# Patient Record
Sex: Female | Born: 1935 | Race: White | Hispanic: No | Smoking: Never smoker
Health system: Southern US, Community
[De-identification: ages and names within clinical notes are randomized; demographics above are authoritative.]

## PROBLEM LIST (undated history)

## (undated) DIAGNOSIS — I10 Essential (primary) hypertension: Secondary | ICD-10-CM

## (undated) HISTORY — PX: ABDOMINAL HYSTERECTOMY: SHX81

---

## 2003-12-19 ENCOUNTER — Other Ambulatory Visit: Payer: Self-pay

## 2003-12-19 ENCOUNTER — Emergency Department: Payer: Self-pay | Admitting: Emergency Medicine

## 2004-01-25 ENCOUNTER — Ambulatory Visit: Payer: Self-pay | Admitting: Internal Medicine

## 2006-01-21 ENCOUNTER — Ambulatory Visit: Payer: Self-pay | Admitting: Internal Medicine

## 2007-02-18 ENCOUNTER — Ambulatory Visit: Payer: Self-pay | Admitting: Internal Medicine

## 2008-02-22 ENCOUNTER — Ambulatory Visit: Payer: Self-pay | Admitting: Internal Medicine

## 2008-03-22 ENCOUNTER — Ambulatory Visit: Payer: Self-pay | Admitting: Unknown Physician Specialty

## 2008-05-26 ENCOUNTER — Emergency Department: Payer: Self-pay | Admitting: Emergency Medicine

## 2009-02-22 ENCOUNTER — Ambulatory Visit: Payer: Self-pay | Admitting: Internal Medicine

## 2010-02-25 ENCOUNTER — Ambulatory Visit: Payer: Self-pay | Admitting: Internal Medicine

## 2010-10-09 ENCOUNTER — Inpatient Hospital Stay: Payer: Self-pay | Admitting: Specialist

## 2011-07-15 ENCOUNTER — Ambulatory Visit: Payer: Self-pay | Admitting: Specialist

## 2011-07-15 DIAGNOSIS — I10 Essential (primary) hypertension: Secondary | ICD-10-CM

## 2011-07-15 LAB — CBC WITH DIFFERENTIAL/PLATELET
Basophil #: 0 10*3/uL (ref 0.0–0.1)
Basophil %: 0.4 %
Eosinophil %: 1.8 %
HCT: 41.2 % (ref 35.0–47.0)
Lymphocyte #: 1.5 10*3/uL (ref 1.0–3.6)
Lymphocyte %: 21.8 %
MCH: 31.6 pg (ref 26.0–34.0)
MCHC: 33.1 g/dL (ref 32.0–36.0)
MCV: 96 fL (ref 80–100)
Monocyte %: 8.6 %
Neutrophil #: 4.5 10*3/uL (ref 1.4–6.5)
Neutrophil %: 67.4 %
RBC: 4.31 10*6/uL (ref 3.80–5.20)
WBC: 6.7 10*3/uL (ref 3.6–11.0)

## 2011-07-24 ENCOUNTER — Ambulatory Visit: Payer: Self-pay | Admitting: Specialist

## 2011-10-26 ENCOUNTER — Emergency Department: Payer: Self-pay | Admitting: Emergency Medicine

## 2013-09-27 ENCOUNTER — Emergency Department: Payer: Self-pay | Admitting: Emergency Medicine

## 2013-09-27 LAB — URINALYSIS, COMPLETE
Bacteria: NONE SEEN
Glucose,UR: NEGATIVE mg/dL (ref 0–75)
Ketone: NEGATIVE
LEUKOCYTE ESTERASE: NEGATIVE
NITRITE: NEGATIVE
Ph: 8 (ref 4.5–8.0)
Protein: NEGATIVE
SPECIFIC GRAVITY: 1.008 (ref 1.003–1.030)
Squamous Epithelial: <1
WBC UR: 1 /HPF (ref 0–5)

## 2013-09-27 LAB — CBC
HCT: 44.8 % (ref 35.0–47.0)
HGB: 14.5 g/dL (ref 12.0–16.0)
MCH: 30.4 pg (ref 26.0–34.0)
MCHC: 32.4 g/dL (ref 32.0–36.0)
MCV: 94 fL (ref 80–100)
Platelet: 157 10*3/uL (ref 150–440)
RBC: 4.77 10*6/uL (ref 3.80–5.20)
RDW: 13.6 % (ref 11.5–14.5)
WBC: 6.2 10*3/uL (ref 3.6–11.0)

## 2013-09-27 LAB — BASIC METABOLIC PANEL
Anion Gap: 6 — ABNORMAL LOW (ref 7–16)
BUN: 18 mg/dL (ref 7–18)
Calcium, Total: 10.8 mg/dL — ABNORMAL HIGH (ref 8.5–10.1)
Chloride: 106 mmol/L (ref 98–107)
Co2: 26 mmol/L (ref 21–32)
Creatinine: 0.78 mg/dL (ref 0.60–1.30)
EGFR (African American): >60
EGFR (Non-African Amer.): >60
GLUCOSE: 110 mg/dL — AB (ref 65–99)
Osmolality: 278 (ref 275–301)
POTASSIUM: 3.7 mmol/L (ref 3.5–5.1)
Sodium: 138 mmol/L (ref 136–145)

## 2013-09-27 LAB — TROPONIN I

## 2013-10-11 ENCOUNTER — Emergency Department: Payer: Self-pay | Admitting: Emergency Medicine

## 2013-10-11 LAB — BASIC METABOLIC PANEL
ANION GAP: 8 (ref 7–16)
BUN: 20 mg/dL — ABNORMAL HIGH (ref 7–18)
CO2: 24 mmol/L (ref 21–32)
Calcium, Total: 9.8 mg/dL (ref 8.5–10.1)
Chloride: 108 mmol/L — ABNORMAL HIGH (ref 98–107)
Creatinine: 0.71 mg/dL (ref 0.60–1.30)
EGFR (African American): >60
EGFR (Non-African Amer.): >60
Glucose: 117 mg/dL — ABNORMAL HIGH (ref 65–99)
Osmolality: 283 (ref 275–301)
Potassium: 4 mmol/L (ref 3.5–5.1)
Sodium: 140 mmol/L (ref 136–145)

## 2013-10-11 LAB — CBC
HCT: 45.7 % (ref 35.0–47.0)
HGB: 15 g/dL (ref 12.0–16.0)
MCH: 30.8 pg (ref 26.0–34.0)
MCHC: 32.8 g/dL (ref 32.0–36.0)
MCV: 94 fL (ref 80–100)
PLATELETS: 169 10*3/uL (ref 150–440)
RBC: 4.86 10*6/uL (ref 3.80–5.20)
RDW: 13.7 % (ref 11.5–14.5)
WBC: 6 10*3/uL (ref 3.6–11.0)

## 2013-10-11 LAB — TROPONIN I: Troponin-I: <0.02 ng/mL

## 2013-10-17 ENCOUNTER — Ambulatory Visit: Payer: Self-pay | Admitting: Internal Medicine

## 2014-05-21 NOTE — Op Note (Signed)
PATIENT NAME:  Caitlin Solis, Caitlin Solis MR#:  [id number] DATE OF BIRTH:  [birth year]  DATE OF PROCEDURE:  07/24/2011  PREOPERATIVE DIAGNOSIS: Painful hardware of right ankle with exposed distal lateral screw.   POSTOPERATIVE DIAGNOSIS: Painful hardware of right ankle with exposed distal lateral screw.   PROCEDURE PERFORMED: Deep hardware removal of right ankle with soft tissue debridement (Plate, 8 screws, and 2 washers).   SURGEON: Valinda HoarHoward E. Gianfranco Araki, M.Solis.   ANESTHESIA: General LMA.   COMPLICATIONS: None.   DRAINS: None.   DESCRIPTION OF PROCEDURE: The patient was brought to the Operating Room where she underwent satisfactory general LMA anesthesia in the supine position. The right leg was prepped and draped in sterile fashion and an Esmarch was applied. The tourniquet was inflated to 350 mmHg. The lateral wound was reopened through the previous incision with a skin knife. The distalmost screw was just partially exposed. There was no active redness or drainage or sign of active infection there. The soft tissue was elevated off the plate and screws and all 6 screws and plate were removed without difficulty. A rongeur was used to remove built up scar tissue. The skin edges were debrided where the screw had been exposed. The soft tissues were undermined anteriorly and posteriorly to allow better closure of the skin. The wound was then irrigated and closed with staples without difficulty. A small incision was then made over the medial malleolus and dissection carried out sharply where both screws were. These were exposed and removed without difficulty along with their washers. The soft tissues were debrided and irrigated. The      skin was closed with staples. 0.5% Marcaine was placed in both wounds and a dry sterile soft dressing was applied. The tourniquet was deflated with good return of blood flow to the foot. The patient was awakened and taken to recovery in good  condition. ____________________________ Valinda HoarHoward E. Lynnley Doddridge, MD hem:slb Solis: 07/24/2011 10:27:06 ET T: 07/24/2011 11:09:32 ET JOB#: [id number]  cc: Valinda HoarHoward E. Babyboy Loya, MD, <Dictator> Valinda HoarHOWARD E Cattaleya Wien MD ELECTRONICALLY SIGNED 07/24/2011 11:28

## 2018-10-03 ENCOUNTER — Emergency Department: Payer: Medicare Other

## 2018-10-03 ENCOUNTER — Other Ambulatory Visit: Payer: Self-pay

## 2018-10-03 ENCOUNTER — Inpatient Hospital Stay
Admission: EM | Admit: 2018-10-03 | Discharge: 2018-10-06 | DRG: 543 | Disposition: A | Discharge status: Skilled Nursing Facility | Payer: Medicare Other | Attending: Internal Medicine | Admitting: Internal Medicine

## 2018-10-03 ENCOUNTER — Inpatient Hospital Stay: Payer: Medicare Other

## 2018-10-03 DIAGNOSIS — Z993 Dependence on wheelchair: Secondary | ICD-10-CM

## 2018-10-03 DIAGNOSIS — E785 Hyperlipidemia, unspecified: Secondary | ICD-10-CM | POA: Diagnosis present

## 2018-10-03 DIAGNOSIS — I110 Hypertensive heart disease with heart failure: Secondary | ICD-10-CM | POA: Diagnosis present

## 2018-10-03 DIAGNOSIS — M21372 Foot drop, left foot: Secondary | ICD-10-CM | POA: Diagnosis present

## 2018-10-03 DIAGNOSIS — W010XXA Fall on same level from slipping, tripping and stumbling without subsequent striking against object, initial encounter: Secondary | ICD-10-CM | POA: Diagnosis present

## 2018-10-03 DIAGNOSIS — M810 Age-related osteoporosis without current pathological fracture: Secondary | ICD-10-CM | POA: Diagnosis present

## 2018-10-03 DIAGNOSIS — I5032 Chronic diastolic (congestive) heart failure: Secondary | ICD-10-CM | POA: Diagnosis present

## 2018-10-03 DIAGNOSIS — R001 Bradycardia, unspecified: Secondary | ICD-10-CM | POA: Diagnosis present

## 2018-10-03 DIAGNOSIS — Z79899 Other long term (current) drug therapy: Secondary | ICD-10-CM

## 2018-10-03 DIAGNOSIS — H9190 Unspecified hearing loss, unspecified ear: Secondary | ICD-10-CM | POA: Diagnosis present

## 2018-10-03 DIAGNOSIS — Z66 Do not resuscitate: Secondary | ICD-10-CM | POA: Diagnosis present

## 2018-10-03 DIAGNOSIS — M8448XA Pathological fracture, other site, initial encounter for fracture: Secondary | ICD-10-CM | POA: Diagnosis not present

## 2018-10-03 DIAGNOSIS — S3282XA Multiple fractures of pelvis without disruption of pelvic ring, initial encounter for closed fracture: Secondary | ICD-10-CM | POA: Diagnosis not present

## 2018-10-03 DIAGNOSIS — I739 Peripheral vascular disease, unspecified: Secondary | ICD-10-CM | POA: Diagnosis present

## 2018-10-03 DIAGNOSIS — I2510 Atherosclerotic heart disease of native coronary artery without angina pectoris: Secondary | ICD-10-CM | POA: Diagnosis present

## 2018-10-03 DIAGNOSIS — Z888 Allergy status to other drugs, medicaments and biological substances status: Secondary | ICD-10-CM | POA: Diagnosis not present

## 2018-10-03 DIAGNOSIS — Z20828 Contact with and (suspected) exposure to other viral communicable diseases: Secondary | ICD-10-CM | POA: Diagnosis present

## 2018-10-03 DIAGNOSIS — M21371 Foot drop, right foot: Secondary | ICD-10-CM | POA: Diagnosis present

## 2018-10-03 DIAGNOSIS — E213 Hyperparathyroidism, unspecified: Secondary | ICD-10-CM | POA: Diagnosis present

## 2018-10-03 DIAGNOSIS — S329XXA Fracture of unspecified parts of lumbosacral spine and pelvis, initial encounter for closed fracture: Secondary | ICD-10-CM

## 2018-10-03 DIAGNOSIS — Z7982 Long term (current) use of aspirin: Secondary | ICD-10-CM

## 2018-10-03 DIAGNOSIS — I4891 Unspecified atrial fibrillation: Secondary | ICD-10-CM | POA: Diagnosis present

## 2018-10-03 HISTORY — DX: Essential (primary) hypertension: I10

## 2018-10-03 LAB — BASIC METABOLIC PANEL
Anion gap: 13 (ref 5–15)
BUN: 35 mg/dL — ABNORMAL HIGH (ref 8–23)
CO2: 25 mmol/L (ref 22–32)
Calcium: 9.6 mg/dL (ref 8.9–10.3)
Chloride: 99 mmol/L (ref 98–111)
Creatinine, Ser: 0.98 mg/dL (ref 0.44–1.00)
GFR calc Af Amer: 58 mL/min — ABNORMAL LOW (ref >60)
GFR calc non Af Amer: 50 mL/min — ABNORMAL LOW (ref >60)
Glucose, Bld: 156 mg/dL — ABNORMAL HIGH (ref 70–99)
Potassium: 3.9 mmol/L (ref 3.5–5.1)
Sodium: 137 mmol/L (ref 135–145)

## 2018-10-03 LAB — CBC
HCT: 41.2 % (ref 36.0–46.0)
Hemoglobin: 13.6 g/dL (ref 12.0–15.0)
MCH: 30.4 pg (ref 26.0–34.0)
MCHC: 33 g/dL (ref 30.0–36.0)
MCV: 92.2 fL (ref 80.0–100.0)
Platelets: 181 10*3/uL (ref 150–400)
RBC: 4.47 MIL/uL (ref 3.87–5.11)
RDW: 14.2 % (ref 11.5–15.5)
WBC: 12.4 10*3/uL — ABNORMAL HIGH (ref 4.0–10.5)
nRBC: 0 % (ref 0.0–0.2)

## 2018-10-03 LAB — TROPONIN I (HIGH SENSITIVITY)
Troponin I (High Sensitivity): 16 ng/L (ref <18)
Troponin I (High Sensitivity): 13 ng/L (ref <18)

## 2018-10-03 LAB — BRAIN NATRIURETIC PEPTIDE: B Natriuretic Peptide: 150 pg/mL — ABNORMAL HIGH (ref 0.0–100.0)

## 2018-10-03 MED ORDER — FENTANYL CITRATE (PF) 100 MCG/2ML IJ SOLN
25.0000 ug | Freq: Once | INTRAMUSCULAR | Status: AC
  Administered 2018-10-03: 11:00:00 25 ug via INTRAVENOUS
  Filled 2018-10-03: qty 2

## 2018-10-03 MED ORDER — LISINOPRIL 20 MG PO TABS
40.0000 mg | ORAL_TABLET | Freq: Every day | ORAL | Status: DC
  Administered 2018-10-04 – 2018-10-06 (×3): 40 mg via ORAL
  Filled 2018-10-03 (×3): qty 2

## 2018-10-03 MED ORDER — CALCIUM CARBONATE-VITAMIN D 500-200 MG-UNIT PO TABS
1.0000 | ORAL_TABLET | Freq: Every day | ORAL | Status: DC
  Administered 2018-10-04 – 2018-10-06 (×3): 1 via ORAL
  Filled 2018-10-03 (×3): qty 1

## 2018-10-03 MED ORDER — MORPHINE SULFATE (PF) 2 MG/ML IV SOLN
0.5000 mg | INTRAVENOUS | Status: DC | PRN
  Administered 2018-10-03 – 2018-10-04 (×2): 0.5 mg via INTRAVENOUS
  Filled 2018-10-03 (×3): qty 1

## 2018-10-03 MED ORDER — HYDRALAZINE HCL 50 MG PO TABS
25.0000 mg | ORAL_TABLET | Freq: Two times a day (BID) | ORAL | Status: DC
  Administered 2018-10-03 – 2018-10-06 (×5): 25 mg via ORAL
  Filled 2018-10-03 (×6): qty 1

## 2018-10-03 MED ORDER — AMLODIPINE BESYLATE 5 MG PO TABS
5.0000 mg | ORAL_TABLET | Freq: Every day | ORAL | Status: DC
  Administered 2018-10-04 – 2018-10-06 (×3): 5 mg via ORAL
  Filled 2018-10-03 (×3): qty 1

## 2018-10-03 MED ORDER — HYDROCODONE-ACETAMINOPHEN 5-325 MG PO TABS
1.0000 | ORAL_TABLET | Freq: Four times a day (QID) | ORAL | Status: DC | PRN
  Administered 2018-10-06: 18:00:00 1 via ORAL
  Filled 2018-10-03: qty 2

## 2018-10-03 MED ORDER — ALENDRONATE SODIUM 70 MG PO TABS: 70.0000 mg | ORAL_TABLET | ORAL | Status: DC

## 2018-10-03 MED ORDER — VITAMIN D 25 MCG (1000 UNIT) PO TABS
1000.0000 [IU] | ORAL_TABLET | Freq: Every day | ORAL | Status: DC
  Administered 2018-10-04 – 2018-10-06 (×3): 1000 [IU] via ORAL
  Filled 2018-10-03 (×3): qty 1

## 2018-10-03 MED ORDER — CARVEDILOL 25 MG PO TABS
25.0000 mg | ORAL_TABLET | Freq: Two times a day (BID) | ORAL | Status: DC
  Administered 2018-10-03 – 2018-10-06 (×5): 25 mg via ORAL
  Filled 2018-10-03 (×6): qty 1

## 2018-10-03 MED ORDER — POTASSIUM CHLORIDE CRYS ER 10 MEQ PO TBCR
10.0000 meq | EXTENDED_RELEASE_TABLET | Freq: Every day | ORAL | Status: DC
  Administered 2018-10-04 – 2018-10-06 (×3): 10 meq via ORAL
  Filled 2018-10-03 (×3): qty 1

## 2018-10-03 MED ORDER — HYDRALAZINE HCL 50 MG PO TABS: 25.0000 mg | ORAL_TABLET | Freq: Two times a day (BID) | ORAL | Status: DC

## 2018-10-03 NOTE — ED Notes (Signed)
Pt taken to xray via stretcher  

## 2018-10-03 NOTE — ED Notes (Signed)
Report given to Ashley, RN

## 2018-10-03 NOTE — ED Notes (Signed)
ED TO INPATIENT HANDOFF REPORT  ED Nurse Name and Phone #: Margie Brink [phone]  S Name/Age/Gender Caitlin BorgEmma D Solis [age] y.o. female Room/Bed: ED19A/ED19A  Code Status   Code Status: Full Code  Home/SNF/Other Home Patient oriented to: self, place, time and situation Is this baseline? Yes   Triage Complete: Triage complete  Chief Complaint fall  Triage Note Pt arrives [facility] from home. Fell in bathroom this AM. Only c/o is R hip pain. No shortening or rotation noted. Pt vomited once at home. Pt c/o nausea upon arrival. HOH. A&O. Normally uses wheelchair and pivots per EMS. Pt was 88% RA with EMS, placed on 3 L [place] and came up to 98%. Does NOT normally wear oxygen. Hx HTN.    Allergies Allergies  Allergen Reactions  . Mirabegron Nausea Only and Nausea And Vomiting    Level of Care/Admitting Diagnosis ED Disposition    ED Disposition Condition Comment   Admit  [facility] [[id number]]  Level of Care: Med-Surg [16]  Covid Evaluation: Asymptomatic Screening Protocol (No Symptoms)  Diagnosis: Bilateral sacral insufficiency fracture [578469][[id number]]  Admitting Physician: Cristie HemUMA, ELIZABETH ACHIENG [[id number]]  Attending Physician: Webb SilversmithUMA, ELIZABETH ACHIENG [[id number]]  Estimated length of stay: past midnight tomorrow  Certification:: I certify this patient will need inpatient services for at least 2 midnights  PT Class (Do Not Modify): Inpatient [101]  PT Acc Code (Do Not Modify): Private [1]       B Medical/Surgery History Past Medical History:  Diagnosis Date  . Hypertension    Past Surgical History:  Procedure Laterality Date  . ABDOMINAL HYSTERECTOMY       A IV Location/Drains/Wounds Patient Lines/Drains/Airways Status   Active Line/Drains/Airways    Name:   Placement date:   Placement time:   Site:   Days:   Peripheral IV 10/03/18 Right Antecubital   10/03/18    0949    Antecubital   less than 1          Intake/Output Last 24 hours No intake or  output data in the 24 hours ending 10/03/18 1322  Labs/Imaging Results for orders placed or performed during the hospital encounter of 10/03/18 (from the past 48 hour(s))  CBC     Status: Abnormal   Collection Time: 10/03/18  9:49 AM  Result Value Ref Range   WBC 12.4 (H) 4.0 - 10.5 K/uL   RBC 4.47 3.87 - 5.11 MIL/uL   Hemoglobin 13.6 12.0 - 15.0 g/dL   HCT 41.341.2 24.436.0 - 01.046.0 %   MCV 92.2 80.0 - 100.0 fL   MCH 30.4 26.0 - 34.0 pg   MCHC 33.0 30.0 - 36.0 g/dL   RDW 27.214.2 53.611.5 - 64.415.5 %   Platelets 181 150 - 400 K/uL   nRBC 0.0 0.0 - 0.2 %    Comment: Performed at [facility], [address] [place], [place] [address]  Brain natriuretic peptide     Status: Abnormal   Collection Time: 10/03/18  9:49 AM  Result Value Ref Range   B Natriuretic Peptide 150.0 (H) 0.0 - 100.0 pg/mL    Comment: Performed at [facility], [address] [place], [place] [address]  Troponin I (High Sensitivity)     Status: None   Collection Time: 10/03/18  9:49 AM  Result Value Ref Range   Troponin I (High Sensitivity) 16 <18 ng/L    Comment: (NOTE) Elevated high sensitivity troponin I (hsTnI) values and significant  changes across serial measurements may suggest  ACS but many other  chronic and acute conditions are known to elevate hsTnI results.  Refer to the "Links" section for chest pain algorithms and additional  guidance. Performed at [facility], [address] [place], [place] [address]   Basic metabolic panel     Status: Abnormal   Collection Time: 10/03/18 11:09 AM  Result Value Ref Range   Sodium 137 135 - 145 mmol/L   Potassium 3.9 3.5 - 5.1 mmol/L   Chloride 99 98 - 111 mmol/L   CO2 25 22 - 32 mmol/L   Glucose, Bld 156 (H) 70 - 99 mg/dL   BUN 35 (H) 8 - 23 mg/dL   Creatinine, Ser 3.33 0.44 - 1.00 mg/dL   Calcium 9.6 8.9 - 83.2 mg/dL   GFR calc non Af Amer 50 (L) >60 mL/min   GFR calc Af Amer 58 (L) >60 mL/min   Anion gap 13 5 - 15    Comment: Performed  at [facility], [address] [place], [place] [address]  Troponin I (High Sensitivity)     Status: None   Collection Time: 10/03/18 11:09 AM  Result Value Ref Range   Troponin I (High Sensitivity) 13 <18 ng/L    Comment: (NOTE) Elevated high sensitivity troponin I (hsTnI) values and significant  changes across serial measurements may suggest ACS but many other  chronic and acute conditions are known to elevate hsTnI results.  Refer to the "Links" section for chest pain algorithms and additional  guidance. Performed at [facility], [address] [place], [place] [address]    Ct Pelvis Wo Contrast  Result Date: 10/03/2018 CLINICAL DATA:  Pelvic and right hip pain after fall getting out of a bathtub today. Initial encounter. EXAM: CT OF THE PELVIS EXTREMITY WITHOUT CONTRAST TECHNIQUE: Multidetector CT imaging of the pelvis was performed according to the standard protocol. Multiplanar CT image reconstructions were also generated. COMPARISON:  Single-view of the pelvis 10/26/2011. FINDINGS: Bones/Joint/Cartilage There are acute bilateral sacral fractures. Left sacral fracture is nondisplaced. There is fragment override of approximately 0.7 cm in the right sacrum. The patient also has acute nondisplaced high bilateral pubic ramus fractures. There is a nondisplaced fracture of the left inferior pubic ramus. Fracture of the right inferior pubic ramus demonstrates 1 shaft width lateral displacement of the anterior fragment and fracture override of 1 cm. No other fracture is identified. Specifically, no hip fracture seen. Hips are located. Moderate degenerative change is present about both hips. Chondrocalcinosis of the left and right labrum is identified. Degenerative change is present at the symphysis pubis. Lower lumbar spine demonstrates loss of disc space height at L4-5 and L5-S1. There is also facet degenerative disease at these levels. Ligaments Suboptimally assessed by CT.  Muscles and Tendons There is a hematoma in the inferior aspect of the right gluteus maximus measuring approximately 4.5 cm transverse by 2.8 cm AP by 3 cm craniocaudal. No muscle tear is identified. Soft tissues Imaged intrapelvic contents demonstrate postoperative change of hysterectomy. Infiltration of subcutaneous fat inferior to the ischial tuberosities bilaterally may be due to decubitus ulcers. IMPRESSION: The examination is positive for bilateral sacral and bilateral superior and inferior pubic ramus fractures. Negative for hip fracture. Focal hematoma in the inferior aspect of the right gluteus maximus. Findings suggestive of bilateral decubitus ulcers over the ischial tuberosities. Lower lumbar spondylosis and moderate appearing bilateral hip osteoarthritis. Electronically Signed   By: Drusilla Kanner M.D.   On: 10/03/2018 10:55   Dg  Chest Port 1 View  Result Date: 10/03/2018 CLINICAL DATA:  Pt states she had bathed self off and was getting out of tub and fell onto R side in bathroom. Vomited once at home. Only c/o is R hip pain. No bruising noted at this time. Pt denies taking blood thinners EXAM: PORTABLE CHEST 1 VIEW COMPARISON:  10/09/2010 FINDINGS: The heart is mildly enlarged. There is atherosclerotic calcification of the thoracic aorta. No pneumothorax. No consolidation or edema. There is deformity of numerous ribs, consistent with bilateral remote fractures. Remote fracture of the LEFT clavicle and RIGHT proximal humerus. No definite acute fractures. IMPRESSION: 1. No evidence for acute cardiopulmonary abnormality. 2. Multiple remote fractures. Electronically Signed   By: Nolon Nations M.D.   On: 10/03/2018 10:42   Dg Hip Unilat  With Pelvis 2-3 Views Right  Result Date: 10/03/2018 CLINICAL DATA:  Pt states she had bathed self off and was getting out of tub and fell onto R side in bathroom. Vomited once at home. Only c/o is R hip pain. No bruising noted at this time. Pt denies taking blood  thinners. EXAM: DG HIP (WITH OR WITHOUT PELVIS) 2-3V RIGHT COMPARISON:  10/26/2011 FINDINGS: There is a comminuted fracture of the RIGHT superior pubic ramus. There is a fracture of the RIGHT inferior pubic ramus. There are fractures of the LEFT superior and inferior pubic rami. Degenerative changes are seen in both hips. The RIGHT hip is intact. No abnormality identified in the LEFT hip. IMPRESSION: Multiple pelvic fractures. Fractures involving at least the RIGHT superior and inferior pubic rami, LEFT superior and inferior pubic rami. RIGHT hip is intact. Given the mechanism of injury, sacral fractures should be considered but are not seen radiographically. Electronically Signed   By: Nolon Nations M.D.   On: 10/03/2018 10:41    Pending Labs Unresulted Labs (From admission, onward)    Start     Ordered   10/03/18 1158  SARS CORONAVIRUS 2 (TAT 6-24 HRS) Nasopharyngeal Nasopharyngeal Swab  (Asymptomatic/Tier 2 Patients Labs)  ONCE - STAT,   STAT    Question Answer Comment  Is this test for diagnosis or screening Screening   Symptomatic for COVID-19 as defined by CDC No   Hospitalized for COVID-19 No   Admitted to ICU for COVID-19 No   Previously tested for COVID-19 No   Resident in a congregate (group) care setting No   Employed in healthcare setting No   Pregnant No      10/03/18 1157          Vitals/Pain Today's Vitals   10/03/18 0941 10/03/18 0942 10/03/18 0946 10/03/18 1106  BP: (!) 215/78  (!) 215/78   Pulse: 76  61   Resp:   (!) 30   Temp:   97.6 F (36.4 C)   TempSrc:   Oral   SpO2: (!) 81%  (S) 98%   Weight:  54.4 kg    Height:  5\' 4"  (1.626 m)    PainSc:  10-Worst pain ever  10-Worst pain ever    Isolation Precautions No active isolations  Medications Medications  amLODipine (NORVASC) tablet 5 mg (has no administration in time range)  carvedilol (COREG) tablet 25 mg (has no administration in time range)  lisinopril (ZESTRIL) tablet 40 mg (has no administration  in time range)  calcium-vitamin D (OSCAL WITH D) 500-200 MG-UNIT per tablet 1 tablet (has no administration in time range)  cholecalciferol (VITAMIN D3) tablet 1,000 Units (has no administration in time range)  potassium chloride SA (K-DUR) CR tablet 10 mEq (has no administration in time range)  HYDROcodone-acetaminophen (NORCO/VICODIN) 5-325 MG per tablet 1-2 tablet (has no administration in time range)  morphine 2 MG/ML injection 0.5 mg (has no administration in time range)  hydrALAZINE (APRESOLINE) tablet 25 mg (has no administration in time range)  fentaNYL (SUBLIMAZE) injection 25 mcg (25 mcg Intravenous Given 10/03/18 1105)    Mobility non-ambulatory High fall risk    R Recommendations: See Admitting Provider Note  Report given to:   Additional Notes: Pelvic fractures

## 2018-10-03 NOTE — ED Notes (Signed)
Pt taken to CT.

## 2018-10-03 NOTE — ED Triage Notes (Signed)
Pt arrives [facility] from home. Fell in bathroom this AM. Only c/o is R hip pain. No shortening or rotation noted. Pt vomited once at home. Pt c/o nausea upon arrival. HOH. A&O. Normally uses wheelchair and pivots per EMS. Pt was 88% RA with EMS, placed on 3 L [place] and came up to 98%. Does NOT normally wear oxygen. Hx HTN.

## 2018-10-03 NOTE — ED Provider Notes (Signed)
[facility] Emergency Department Provider Note   ____________________________________________   First MD Initiated Contact with Patient 10/03/18 [id number]     (approximate)  I have reviewed the triage vital signs and the nursing notes.   HISTORY  Chief Complaint Hip Pain    HPI Caitlin Solis is a 85 y.o. female here for evaluation for pain over her right hip  Patient was using her bar when she reports she is transferring to the toilet, she missed the bar and fell onto her right hip.  Denies hitting her head or other injury but she is having a lot of pain over the area of her right hip.  No fevers or chills.  No nausea vomiting.  Has a lot of balance problems all the time according to the patient's daughter-in-law.  No exposure to COVID.  No fevers.  She reports significant sharp pain pointing over her right lateral pelvis right hip region with movement.  Appears in pain with any movement, but reports minimal discomfort at rest   Past Medical History:  Diagnosis Date   Hypertension     There are no active problems to display for this patient.   Past Surgical History:  Procedure Laterality Date   ABDOMINAL HYSTERECTOMY      Prior to Admission medications   Not on File    Allergies Patient has no known allergies.  History reviewed. No pertinent family history.  Social History Social History   Tobacco Use   Smoking status: Never Smoker  Substance Use Topics   Alcohol use: Not Currently    Frequency: Never   Drug use: Not on file    Review of Systems Constitutional: No fever/chills Eyes: No visual changes. Cardiovascular: Denies chest pain. Respiratory: Denies shortness of breath. Gastrointestinal: No abdominal pain.   Musculoskeletal: Negative for back pain or neck pain.  No injury to the lower legs except over the right hip.  No pain in the left leg or hip.  No pain in the arms. Skin: Negative for rash. Neurological:  Negative for headaches, areas of focal weakness or numbness.    ____________________________________________   PHYSICAL EXAM:  VITAL SIGNS: ED Triage Vitals  Enc Vitals Group     BP 10/03/18 0941 (!) 215/78     Pulse Rate 10/03/18 0941 76     Resp 10/03/18 0946 (!) 30     Temp 10/03/18 0946 97.6 F (36.4 C)     Temp Source 10/03/18 0946 Oral     SpO2 10/03/18 0941 (!) 81 %     Weight 10/03/18 0942 120 lb (54.4 kg)     Height 10/03/18 0942 5\' 4"  (1.626 m)     Head Circumference --      Peak Flow --      Pain Score 10/03/18 0942 10     Pain Loc --      Pain Edu? --      Excl. in [place]? --     Constitutional: Alert and oriented. Well appearing and in no acute distress.  Very hard of hearing Eyes: Conjunctivae are normal. Head: Atraumatic. Nose: No congestion/rhinnorhea. Mouth/Throat: Mucous membranes are moist. Neck: No stridor.  Cardiovascular: Normal rate, regular rhythm. Grossly normal heart sounds.  Good peripheral circulation. Respiratory: Normal respiratory effort.  No retractions. Lungs CTAB. Gastrointestinal: Soft and nontender. No distention. Musculoskeletal: Focally tender over the superior portion of the greater trochanter and the right pelvic region.  No deformities or shortening.  Left lower extremity full range  of motion without pain or discomfort on any pain joint or long bone.  Right lower extremity movement induces pain around the right upper hip region right lower pelvis.  Good peripheral perfusion and normal cap refill lower extremities bilateral.  Able to wiggle toes and intact sensation normally over the right leg Neurologic:  Normal speech and language. No gross focal neurologic deficits are appreciated.  Skin:  Skin is warm, dry and intact. No rash noted. Psychiatric: Mood and affect are normal. Speech and behavior are normal.  ____________________________________________   LABS (all labs ordered are listed, but only abnormal results are  displayed)  Labs Reviewed  CBC - Abnormal; Notable for the following components:      Result Value   WBC 12.4 (*)    All other components within normal limits  BRAIN NATRIURETIC PEPTIDE - Abnormal; Notable for the following components:   B Natriuretic Peptide 150.0 (*)    All other components within normal limits  BASIC METABOLIC PANEL - Abnormal; Notable for the following components:   Glucose, Bld 156 (*)    BUN 35 (*)    GFR calc non Af Amer 50 (*)    GFR calc Af Amer 58 (*)    All other components within normal limits  SARS CORONAVIRUS 2 (TAT 6-24 HRS)  TROPONIN I (HIGH SENSITIVITY)  TROPONIN I (HIGH SENSITIVITY)   ____________________________________________  EKG  I have reviewed interpreted at 945 Heart rate 109 QRS 130 QTc 500 Somewhat poor baseline, probably A. fib, right bundle branch block.  No evidence of acute ischemia.  Nonspecific T wave abnormality ____________________________________________  RADIOLOGY  Ct Pelvis Wo Contrast  Result Date: 10/03/2018 CLINICAL DATA:  Pelvic and right hip pain after fall getting out of a bathtub today. Initial encounter. EXAM: CT OF THE PELVIS EXTREMITY WITHOUT CONTRAST TECHNIQUE: Multidetector CT imaging of the pelvis was performed according to the standard protocol. Multiplanar CT image reconstructions were also generated. COMPARISON:  Single-view of the pelvis 10/26/2011. FINDINGS: Bones/Joint/Cartilage There are acute bilateral sacral fractures. Left sacral fracture is nondisplaced. There is fragment override of approximately 0.7 cm in the right sacrum. The patient also has acute nondisplaced high bilateral pubic ramus fractures. There is a nondisplaced fracture of the left inferior pubic ramus. Fracture of the right inferior pubic ramus demonstrates 1 shaft width lateral displacement of the anterior fragment and fracture override of 1 cm. No other fracture is identified. Specifically, no hip fracture seen. Hips are located. Moderate  degenerative change is present about both hips. Chondrocalcinosis of the left and right labrum is identified. Degenerative change is present at the symphysis pubis. Lower lumbar spine demonstrates loss of disc space height at L4-5 and L5-S1. There is also facet degenerative disease at these levels. Ligaments Suboptimally assessed by CT. Muscles and Tendons There is a hematoma in the inferior aspect of the right gluteus maximus measuring approximately 4.5 cm transverse by 2.8 cm AP by 3 cm craniocaudal. No muscle tear is identified. Soft tissues Imaged intrapelvic contents demonstrate postoperative change of hysterectomy. Infiltration of subcutaneous fat inferior to the ischial tuberosities bilaterally may be due to decubitus ulcers. IMPRESSION: The examination is positive for bilateral sacral and bilateral superior and inferior pubic ramus fractures. Negative for hip fracture. Focal hematoma in the inferior aspect of the right gluteus maximus. Findings suggestive of bilateral decubitus ulcers over the ischial tuberosities. Lower lumbar spondylosis and moderate appearing bilateral hip osteoarthritis. Electronically Signed   By: Drusilla Kanner M.D.   On: 10/03/2018 10:55  Dg Chest Port 1 View  Result Date: 10/03/2018 CLINICAL DATA:  Pt states she had bathed self off and was getting out of tub and fell onto R side in bathroom. Vomited once at home. Only c/o is R hip pain. No bruising noted at this time. Pt denies taking blood thinners EXAM: PORTABLE CHEST 1 VIEW COMPARISON:  10/09/2010 FINDINGS: The heart is mildly enlarged. There is atherosclerotic calcification of the thoracic aorta. No pneumothorax. No consolidation or edema. There is deformity of numerous ribs, consistent with bilateral remote fractures. Remote fracture of the LEFT clavicle and RIGHT proximal humerus. No definite acute fractures. IMPRESSION: 1. No evidence for acute cardiopulmonary abnormality. 2. Multiple remote fractures. Electronically  Signed   By: Norva PavlovElizabeth  Brown M.D.   On: 10/03/2018 10:42   Dg Hip Unilat  With Pelvis 2-3 Views Right  Result Date: 10/03/2018 CLINICAL DATA:  Pt states she had bathed self off and was getting out of tub and fell onto R side in bathroom. Vomited once at home. Only c/o is R hip pain. No bruising noted at this time. Pt denies taking blood thinners. EXAM: DG HIP (WITH OR WITHOUT PELVIS) 2-3V RIGHT COMPARISON:  10/26/2011 FINDINGS: There is a comminuted fracture of the RIGHT superior pubic ramus. There is a fracture of the RIGHT inferior pubic ramus. There are fractures of the LEFT superior and inferior pubic rami. Degenerative changes are seen in both hips. The RIGHT hip is intact. No abnormality identified in the LEFT hip. IMPRESSION: Multiple pelvic fractures. Fractures involving at least the RIGHT superior and inferior pubic rami, LEFT superior and inferior pubic rami. RIGHT hip is intact. Given the mechanism of injury, sacral fractures should be considered but are not seen radiographically. Electronically Signed   By: Norva PavlovElizabeth  Brown M.D.   On: 10/03/2018 10:41    CT reviewed multiple pelvic fractures.  Right superior and inferior pubic rami.  Left superior and inferior pubic rami.  Right hip is intact.  Reviewed by me.  Chest x-ray negative for acute. ____________________________________________   PROCEDURES  Procedure(s) performed: None  Procedures  Critical Care performed: No  ____________________________________________   INITIAL IMPRESSION / ASSESSMENT AND PLAN / ED COURSE  Pertinent labs & imaging results that were available during my care of the patient were reviewed by me and considered in my medical decision making (see chart for details).   Patient reports mechanical fall without head injury.  Reassuring clinical exam, except notable pain discomfort over palpation of the right pelvic region.  X-ray imaging and CT imaging performed which demonstrate multiple pelvic fractures and  a small hematoma.  Discussed with the patient and her family, due to severity of pain with any movement I do not anticipate she build to go home without better pain control regimen, physical therapy, and likely some type of rehab type planning as her son is also in his 2670s and reports to be very difficult to care for her per her daughter-in-law in the home setting.  I would agree with this, patient comfortable with plan for admission.  Pain is improved with fentanyl and rest, but movement induces pain and discomfort    Discussed with Lanora ManisElizabeth nurse practitioner hospitalist service   Glenetta BorgEmma D Veron was evaluated in Emergency Department on 10/03/2018 for the symptoms described in the history of present illness. She was evaluated in the context of the global COVID-19 pandemic, which necessitated consideration that the patient might be at risk for infection with the SARS-CoV-2 virus that causes COVID-19. Institutional  protocols and algorithms that pertain to the evaluation of patients at risk for COVID-19 are in a state of rapid change based on information released by regulatory bodies including the CDC and federal and state organizations. These policies and algorithms were followed during the patient's care in the ED. ____________________________________________   FINAL CLINICAL IMPRESSION(S) / ED DIAGNOSES  Final diagnoses:  Closed nondisplaced fracture of pelvis, unspecified part of pelvis, initial encounter [facility](HCC)        Note:  This document was prepared using Dragon voice recognition software and may include unintentional dictation errors       Sharyn CreamerQuale, Reighlyn Elmes, MD 10/03/18 1205

## 2018-10-03 NOTE — Progress Notes (Signed)
PHARMACIST - PHYSICIAN COMMUNICATION  CONCERNING: P&T Medication Policy Regarding Oral Bisphosphonates  RECOMMENDATION: Your order for alendronate (Fosamax), ibandronate (Boniva), or risedronate (Actonel) has been discontinued at this time.  If the patient's post-hospital medical condition warrants safe use of this class of drugs, please resume the pre-hospital regimen upon discharge.  DESCRIPTION:  Alendronate (Fosamax), ibandronate (Boniva), and risedronate (Actonel) can cause severe esophageal erosions in patients who are unable to remain upright at least 30 minutes after taking this medication.   Since brief interruptions in therapy are thought to have minimal impact on bone mineral density, the [place] has established that bisphosphonate orders should be routinely discontinued during hospitalization.   To override this safety policy and permit administration of Boniva, Fosamax, or Actonel in the hospital, prescribers must write "DO NOT HOLD" in the comments section when placing the order for this class of medications.   Pernell Dupre, PharmD, BCPS Clinical Pharmacist 10/03/2018 1:11 PM

## 2018-10-03 NOTE — ED Notes (Signed)
Pt states she had bathed self off and was getting out of tub and fell onto R side in bathroom. Vomited once at home. Only c/o is R hip pain. No bruising noted at this time. Pt denies taking blood thinners. Pt was hypoxic at 81% RA, placed on 3 L [place] and is up to 98%, will continue to monitor.

## 2018-10-03 NOTE — H&P (Addendum)
[facility] NAME: Caitlin Solis    MR#:  [id number]  DATE OF BIRTH:  02-25-1924  DATE OF ADMISSION:  10/03/2018  PRIMARY CARE PHYSICIAN: Patient, No Pcp Per   REQUESTING/REFERRING PHYSICIAN: Delman Kitten MD  CHIEF COMPLAINT:   Chief Complaint  Patient presents with   Hip Pain    HISTORY OF PRESENT ILLNESS:   85 year old female with past medical history of hypertension, hyperparathyroidism, CHF,CAD, hyperlipidemia, PVD, bradycardia, and osteoporosis wheelchair bound at baseline presenting to the ED with chief complaints of right hip pain following a fall.  Patient states she was taking a bath in her tub today and got up from her wheelchair to hold the handrail when she suddenly slipped and fell.  She denies hitting her head or losing consciousness. Per patient daughter-in-law who is currently at the bedside, patient had an episode of nausea and vomiting following the fall.  She is very hard of hearing and incontinent at baseline.  She denies associated symptoms following the fall of dizziness, chest pain, shortness of breath, nausea or vomiting, diaphoresis, or palpitation.  Patient was brought to the ED for further evaluation.  On arrival to the ED, she was afebrile with blood pressure 215/78 mm Hg and pulse rate 61 beats/min. There were no focal neurological deficits; he was alert and oriented x4, and she did not demonstrate any memory deficits.  Initial labs revealed WBC of 12.4, BNP 150, glucose 156, BUN 35.  Hip x-ray showed multiple pelvic and sacral fracture.  CT pelvis showed bilateral sacral and pubic ramus fractures, there is also focal hematoma in the inferior aspect of the right gluteal maximus consistent with bilateral decubitus ulcers over the initial tuberosity.  Given this finding patient will be admitted for further work-up and management.  PAST MEDICAL HISTORY:   Past Medical History:  Diagnosis Date   Hypertension      PAST SURGICAL HISTORY:   Past Surgical History:  Procedure Laterality Date   ABDOMINAL HYSTERECTOMY      SOCIAL HISTORY:   Social History   Tobacco Use   Smoking status: Never Smoker  Substance Use Topics   Alcohol use: Not Currently    Frequency: Never    FAMILY HISTORY:  History reviewed. No pertinent family history.  DRUG ALLERGIES:   Allergies  Allergen Reactions   Mirabegron Nausea Only and Nausea And Vomiting    REVIEW OF SYSTEMS:   Review of Systems  Constitutional: Negative for chills, fever, malaise/fatigue and weight loss.  HENT: Positive for hearing loss. Negative for congestion and sore throat.   Eyes: Negative for blurred vision and double vision.  Respiratory: Negative for cough, shortness of breath and wheezing.   Cardiovascular: Negative for chest pain, palpitations, orthopnea and leg swelling.  Gastrointestinal: Positive for nausea and vomiting. Negative for abdominal pain and diarrhea.  Genitourinary: Positive for urgency. Negative for dysuria.       Incontinent  Musculoskeletal: Positive for back pain, falls and joint pain. Negative for myalgias.  Skin: Negative for rash.  Neurological: Negative for dizziness, sensory change, speech change, focal weakness and headaches.  Psychiatric/Behavioral: Negative for depression.   MEDICATIONS AT HOME:   Prior to Admission medications   Medication Sig Start Date End Date Taking? Authorizing Provider  alendronate (FOSAMAX) 70 MG tablet Take 70 mg by mouth every 7 (seven) days. 09/07/18  Yes [provider]  amLODipine (NORVASC) 5 MG tablet Take 5 mg by mouth daily. 07/19/18  Yes [provider]  aspirin 325 MG tablet Take 325 mg by mouth at bedtime.   Yes [provider]  calcium-vitamin D (OSCAL WITH D) 500-200 MG-UNIT tablet Take 1 tablet by mouth daily.   Yes [provider]  carvedilol (COREG) 25 MG tablet Take 25 mg by mouth 2 (two) times daily. 08/05/18  Yes  [provider]  cholecalciferol (VITAMIN D3) 25 MCG (1000 UT) tablet Take 1,000 Units by mouth daily.   Yes [provider]  furosemide (LASIX) 80 MG tablet Take 80 mg by mouth daily. 07/19/18  Yes [provider]  hydrALAZINE (APRESOLINE) 25 MG tablet Take 25 mg by mouth 2 (two) times daily. 09/09/18  Yes [provider]  lisinopril (ZESTRIL) 40 MG tablet Take 40 mg by mouth daily. 08/05/18  Yes [provider]  potassium chloride (K-DUR) 10 MEQ tablet Take 10 mEq by mouth daily. 09/01/18  Yes [provider]     VITAL SIGNS:  Blood pressure (!) 215/78, pulse 61, temperature 97.6 F (36.4 C), temperature source Oral, resp. rate (!) 30, height 5\' 4"  (1.626 m), weight 54.4 kg, SpO2 (S) 98 %.  PHYSICAL EXAMINATION:   Physical Exam  GENERAL:  85 y.o.-year-old patient lying in the bed with no acute distress.  EYES: Pupils equal, round, reactive to light and accommodation. No scleral icterus. Extraocular muscles intact.  HEENT: Head atraumatic, normocephalic. Oropharynx and nasopharynx clear.  NECK:  Supple, no jugular venous distention. No thyroid enlargement, no tenderness.  LUNGS: Normal breath sounds bilaterally, no wheezing, rales,rhonchi or crepitation. No use of accessory muscles of respiration.  CARDIOVASCULAR: S1, S2 normal. No murmurs, rubs, or gallops.  ABDOMEN: Soft, nontender, nondistended. Bowel sounds present. No organomegaly or mass.  EXTREMITIES: No pedal edema, cyanosis, or clubbing. No rash or lesions. + pedal pulses MUSCULOSKELETAL: Normal bulk, and power was 5+ grip Bilateral upper extremity foot drop noted bilaterally left greater than right NEUROLOGIC: Alert and oriented x 3. CN 2-12 intact.  Very hard of hearing.  Sensation to light touch and cold stimuli intact bilaterally. Gait not tested due to safety concern. PSYCHIATRIC: The patient is alert and oriented x 3.  SKIN: No obvious rash, lesion, or ulcer.   DATA  REVIEWED:  LABORATORY PANEL:   CBC Recent Labs  Lab 10/03/18 0949  WBC 12.4*  HGB 13.6  HCT 41.2  PLT 181   ------------------------------------------------------------------------------------------------------------------  Chemistries  Recent Labs  Lab 10/03/18 1109  NA 137  K 3.9  CL 99  CO2 25  GLUCOSE 156*  BUN 35*  CREATININE 0.98  CALCIUM 9.6   ------------------------------------------------------------------------------------------------------------------  Cardiac Enzymes No results for input(s): TROPONINI in the last 168 hours. ------------------------------------------------------------------------------------------------------------------  RADIOLOGY:  Ct Pelvis Wo Contrast  Result Date: 10/03/2018 CLINICAL DATA:  Pelvic and right hip pain after fall getting out of a bathtub today. Initial encounter. EXAM: CT OF THE PELVIS EXTREMITY WITHOUT CONTRAST TECHNIQUE: Multidetector CT imaging of the pelvis was performed according to the standard protocol. Multiplanar CT image reconstructions were also generated. COMPARISON:  Single-view of the pelvis 10/26/2011. FINDINGS: Bones/Joint/Cartilage There are acute bilateral sacral fractures. Left sacral fracture is nondisplaced. There is fragment override of approximately 0.7 cm in the right sacrum. The patient also has acute nondisplaced high bilateral pubic ramus fractures. There is a nondisplaced fracture of the left inferior pubic ramus. Fracture of the right inferior pubic ramus demonstrates 1 shaft width lateral displacement of the anterior fragment and fracture override of 1 cm. No other fracture  is identified. Specifically, no hip fracture seen. Hips are located. Moderate degenerative change is present about both hips. Chondrocalcinosis of the left and right labrum is identified. Degenerative change is present at the symphysis pubis. Lower lumbar spine demonstrates loss of disc space height at L4-5 and L5-S1. There is also  facet degenerative disease at these levels. Ligaments Suboptimally assessed by CT. Muscles and Tendons There is a hematoma in the inferior aspect of the right gluteus maximus measuring approximately 4.5 cm transverse by 2.8 cm AP by 3 cm craniocaudal. No muscle tear is identified. Soft tissues Imaged intrapelvic contents demonstrate postoperative change of hysterectomy. Infiltration of subcutaneous fat inferior to the ischial tuberosities bilaterally may be due to decubitus ulcers. IMPRESSION: The examination is positive for bilateral sacral and bilateral superior and inferior pubic ramus fractures. Negative for hip fracture. Focal hematoma in the inferior aspect of the right gluteus maximus. Findings suggestive of bilateral decubitus ulcers over the ischial tuberosities. Lower lumbar spondylosis and moderate appearing bilateral hip osteoarthritis. Electronically Signed   By: Drusilla Kannerhomas  Dalessio M.D.   On: 10/03/2018 10:55   Dg Chest Port 1 View  Result Date: 10/03/2018 CLINICAL DATA:  Pt states she had bathed self off and was getting out of tub and fell onto R side in bathroom. Vomited once at home. Only c/o is R hip pain. No bruising noted at this time. Pt denies taking blood thinners EXAM: PORTABLE CHEST 1 VIEW COMPARISON:  10/09/2010 FINDINGS: The heart is mildly enlarged. There is atherosclerotic calcification of the thoracic aorta. No pneumothorax. No consolidation or edema. There is deformity of numerous ribs, consistent with bilateral remote fractures. Remote fracture of the LEFT clavicle and RIGHT proximal humerus. No definite acute fractures. IMPRESSION: 1. No evidence for acute cardiopulmonary abnormality. 2. Multiple remote fractures. Electronically Signed   By: Norva PavlovElizabeth  Brown M.D.   On: 10/03/2018 10:42   Dg Hip Unilat  With Pelvis 2-3 Views Right  Result Date: 10/03/2018 CLINICAL DATA:  Pt states she had bathed self off and was getting out of tub and fell onto R side in bathroom. Vomited once at  home. Only c/o is R hip pain. No bruising noted at this time. Pt denies taking blood thinners. EXAM: DG HIP (WITH OR WITHOUT PELVIS) 2-3V RIGHT COMPARISON:  10/26/2011 FINDINGS: There is a comminuted fracture of the RIGHT superior pubic ramus. There is a fracture of the RIGHT inferior pubic ramus. There are fractures of the LEFT superior and inferior pubic rami. Degenerative changes are seen in both hips. The RIGHT hip is intact. No abnormality identified in the LEFT hip. IMPRESSION: Multiple pelvic fractures. Fractures involving at least the RIGHT superior and inferior pubic rami, LEFT superior and inferior pubic rami. RIGHT hip is intact. Given the mechanism of injury, sacral fractures should be considered but are not seen radiographically. Electronically Signed   By: Norva PavlovElizabeth  Brown M.D.   On: 10/03/2018 10:41    EKG:  EKG: unchanged from previous tracings, atrial fibrillation, rate 98. Vent. rate 98 BPM PR interval * ms QRS duration 135 ms QT/QTc 473/507 ms P-R-T axes * 65 79 IMPRESSION AND PLAN:   85 y.o. female  with past medical history of hypertension, hyperparathyroidism, CHF,CAD, hyperlipidemia, PVD, bradycardia, and osteoporosis wheelchair bound at baseline presenting to the ED with chief complaints of right hip pain following a fall  1. Pelvic fractures -secondary to mechanical fall - CT pelvis showed bilateral sacral and pubic ramus fractures, there is also focal hematoma in the inferior aspect  of the right gluteal maximus - Admit to MedSurg unit - Check CT head given patient had vomiting following the fall also noted with A. fib on monitor with no history of atrial fibrillation. - PRN pain medication - Check UA - We will need PT/OT consult - Social work/case management consult - Orthopedic consult to Dr. Salomon MastPoggie  2. Chronic Diastolic Congestive Heart Failure: BNP mildly elevated at 150 no evidence of exacerbation - Chest x-ray shows no pulmonary vascular congestion  - Last  Echo 07/2016 , EF 55% - Continue lisinopril and Coreg - Hold Lasix for now - Echocardiogram pending - Low salt diet  - Check daily weight - Strict I&Os  3. Atrial fibrillation-noted on monitor, no history of A. fib likely new onset -Following with Dr. Gwen PoundsKowalski  4. Coronary Artery Disease  -Hold ASA in the setting of focal hematoma  5. HLD  + Goal LDL<100  6. HTN  + Goal BP <130/80 -Continue lisinopril, hydralazine, Coreg and amlodipine  7. DVT prophylaxis - Hold anti-coagulation for hematoma    All the records are reviewed and case discussed with ED provider. Management plans discussed with the patient, family and they are in agreement.  CODE STATUS: FULL  TOTAL TIME TAKING CARE OF THIS PATIENT: 50 minutes.    on 10/03/2018 at 1:06 PM   Webb SilversmithElizabeth Araseli Sherry, DNP, FNP-BC [facility] Nurse Practitioner Between 7am to 6pm - Pager [phone]- [phone]  After 6pm go to [email or web address] - password [email or web address]  [facility]  Office  [phone]  CC: Primary care physician; Patient, No Pcp Per

## 2018-10-03 NOTE — ED Notes (Addendum)
Called [facility]A to get an update- was told they are still waiting on a discharge with no ETA on a bed

## 2018-10-03 NOTE — Progress Notes (Signed)
PT Cancellation Note  Patient Details Name: Caitlin Solis MRN: [id number] DOB: 1924-04-16   Cancelled Treatment:    Reason Eval/Treat Not Completed: Active bedrest order. Pt with active bedrest order at this time. Also after talking with nursing they are planning on admitting pt to the floor and pt is in a lot of pain. PT will follow up as appropriate.   Zachary George PT, [place] 3:07 PM,10/03/18 [phone]

## 2018-10-03 NOTE — ED Notes (Signed)
Pt cleansed of urine and placed on a purewick

## 2018-10-03 NOTE — ED Notes (Signed)
This RN introduced self to pt. Labs drawn and sent. Pt given warm blanket and pillow. Will continue to monitor.

## 2018-10-03 NOTE — ED Notes (Signed)
Pt states pain is 10/10 in right hip. Pt given pain meds per MD order. Pt daughter-in-law at bedside. Will continue to monitor.

## 2018-10-03 NOTE — Consult Note (Signed)
ORTHOPAEDIC CONSULTATION  REQUESTING PHYSICIAN: Jimmye Normanuma, Elizabeth Achieng,*  Chief Complaint:   Bilateral hip pain, right greater than left.  History of Present Illness: Caitlin Solis is a 85 y.o. female with a history of hypertension who lives independently, but is closely followed on a daily basis by her son and daughter-in-law.  Apparently, the patient was in her usual state of health this morning when she went to take a bath before her son or daughter-in-law had arrived.  As she got out of the tub, she slipped and fell, landing on her buttock.  She was brought to the emergency room where x-rays demonstrated bilateral superior and inferior pubic rami fractures.  A subsequent CT scan of the pelvis demonstrated bilateral sacral insufficiency fractures in addition to the bilateral superior and inferior pubic rami fractures.  The right sacral insufficiency fracture as well as the right superior and inferior pubic rami fractures both were more displaced than the left side.  The patient normally spends her day in a wheelchair and does not ambulate, but is able to stand to transfer.  Apparently she developed bilateral foot drop several years ago which made her wheelchair dependent.  The patient denies any associated injury resulting from the fall.  She did not strike her head or lose consciousness.  She also denies any lightheadedness, dizziness, chest pain, shortness of breath, or other symptoms which may have precipitated her fall.  Past Medical History:  Diagnosis Date  . Hypertension    Past Surgical History:  Procedure Laterality Date  . ABDOMINAL HYSTERECTOMY     Social History   Socioeconomic History  . Marital status: Widowed    Spouse name: Not on file  . Number of children: Not on file  . Years of education: Not on file  . Highest education level: Not on file  Occupational History  . Not on file  Social Needs  .  Financial resource strain: Not on file  . Food insecurity    Worry: Not on file    Inability: Not on file  . Transportation needs    Medical: Not on file    Non-medical: Not on file  Tobacco Use  . Smoking status: Never Smoker  Substance and Sexual Activity  . Alcohol use: Not Currently    Frequency: Never  . Drug use: Not on file  . Sexual activity: Not on file  Lifestyle  . Physical activity    Days per week: Not on file    Minutes per session: Not on file  . Stress: Not on file  Relationships  . Social Musicianconnections    Talks on phone: Not on file    Gets together: Not on file    Attends religious service: Not on file    Active member of club or organization: Not on file    Attends meetings of clubs or organizations: Not on file    Relationship status: Not on file  Other Topics Concern  . Not on file  Social History Narrative  . Not on file   History reviewed. No pertinent family history. Allergies  Allergen Reactions  . Mirabegron Nausea Only and Nausea And Vomiting   Prior to Admission medications   Medication Sig Start Date End Date Taking? Authorizing Provider  alendronate (FOSAMAX) 70 MG tablet Take 70 mg by mouth every 7 (seven) days. 09/07/18  Yes [provider]  amLODipine (NORVASC) 5 MG tablet Take 5 mg by mouth daily. 07/19/18  Yes [provider]  aspirin 325  MG tablet Take 325 mg by mouth at bedtime.   Yes [provider]  calcium-vitamin D (OSCAL WITH D) 500-200 MG-UNIT tablet Take 1 tablet by mouth daily.   Yes [provider]  carvedilol (COREG) 25 MG tablet Take 25 mg by mouth 2 (two) times daily. 08/05/18  Yes [provider]  cholecalciferol (VITAMIN D3) 25 MCG (1000 UT) tablet Take 1,000 Units by mouth daily.   Yes [provider]  furosemide (LASIX) 80 MG tablet Take 80 mg by mouth daily. 07/19/18  Yes [provider]  hydrALAZINE (APRESOLINE) 25 MG tablet Take 25 mg by mouth 2 (two) times  daily. 09/09/18  Yes [provider]  lisinopril (ZESTRIL) 40 MG tablet Take 40 mg by mouth daily. 08/05/18  Yes [provider]  potassium chloride (K-DUR) 10 MEQ tablet Take 10 mEq by mouth daily. 09/01/18  Yes [provider]   Ct Pelvis Wo Contrast  Result Date: 10/03/2018 CLINICAL DATA:  Pelvic and right hip pain after fall getting out of a bathtub today. Initial encounter. EXAM: CT OF THE PELVIS EXTREMITY WITHOUT CONTRAST TECHNIQUE: Multidetector CT imaging of the pelvis was performed according to the standard protocol. Multiplanar CT image reconstructions were also generated. COMPARISON:  Single-view of the pelvis 10/26/2011. FINDINGS: Bones/Joint/Cartilage There are acute bilateral sacral fractures. Left sacral fracture is nondisplaced. There is fragment override of approximately 0.7 cm in the right sacrum. The patient also has acute nondisplaced high bilateral pubic ramus fractures. There is a nondisplaced fracture of the left inferior pubic ramus. Fracture of the right inferior pubic ramus demonstrates 1 shaft width lateral displacement of the anterior fragment and fracture override of 1 cm. No other fracture is identified. Specifically, no hip fracture seen. Hips are located. Moderate degenerative change is present about both hips. Chondrocalcinosis of the left and right labrum is identified. Degenerative change is present at the symphysis pubis. Lower lumbar spine demonstrates loss of disc space height at L4-5 and L5-S1. There is also facet degenerative disease at these levels. Ligaments Suboptimally assessed by CT. Muscles and Tendons There is a hematoma in the inferior aspect of the right gluteus maximus measuring approximately 4.5 cm transverse by 2.8 cm AP by 3 cm craniocaudal. No muscle tear is identified. Soft tissues Imaged intrapelvic contents demonstrate postoperative change of hysterectomy. Infiltration of subcutaneous fat inferior to the ischial tuberosities  bilaterally may be due to decubitus ulcers. IMPRESSION: The examination is positive for bilateral sacral and bilateral superior and inferior pubic ramus fractures. Negative for hip fracture. Focal hematoma in the inferior aspect of the right gluteus maximus. Findings suggestive of bilateral decubitus ulcers over the ischial tuberosities. Lower lumbar spondylosis and moderate appearing bilateral hip osteoarthritis. Electronically Signed   By: Drusilla Kanner M.D.   On: 10/03/2018 10:55   Dg Chest Port 1 View  Result Date: 10/03/2018 CLINICAL DATA:  Pt states she had bathed self off and was getting out of tub and fell onto R side in bathroom. Vomited once at home. Only c/o is R hip pain. No bruising noted at this time. Pt denies taking blood thinners EXAM: PORTABLE CHEST 1 VIEW COMPARISON:  10/09/2010 FINDINGS: The heart is mildly enlarged. There is atherosclerotic calcification of the thoracic aorta. No pneumothorax. No consolidation or edema. There is deformity of numerous ribs, consistent with bilateral remote fractures. Remote fracture of the LEFT clavicle and RIGHT proximal humerus. No definite acute fractures. IMPRESSION: 1. No evidence for acute cardiopulmonary abnormality. 2. Multiple remote fractures. Electronically  Signed   By: Nolon Nations M.D.   On: 10/03/2018 10:42   Dg Hip Unilat  With Pelvis 2-3 Views Right  Result Date: 10/03/2018 CLINICAL DATA:  Pt states she had bathed self off and was getting out of tub and fell onto R side in bathroom. Vomited once at home. Only c/o is R hip pain. No bruising noted at this time. Pt denies taking blood thinners. EXAM: DG HIP (WITH OR WITHOUT PELVIS) 2-3V RIGHT COMPARISON:  10/26/2011 FINDINGS: There is a comminuted fracture of the RIGHT superior pubic ramus. There is a fracture of the RIGHT inferior pubic ramus. There are fractures of the LEFT superior and inferior pubic rami. Degenerative changes are seen in both hips. The RIGHT hip is intact. No  abnormality identified in the LEFT hip. IMPRESSION: Multiple pelvic fractures. Fractures involving at least the RIGHT superior and inferior pubic rami, LEFT superior and inferior pubic rami. RIGHT hip is intact. Given the mechanism of injury, sacral fractures should be considered but are not seen radiographically. Electronically Signed   By: Nolon Nations M.D.   On: 10/03/2018 10:41    Positive ROS: All other systems have been reviewed and were otherwise negative with the exception of those mentioned in the HPI and as above.  Physical Exam: General:  Alert, no acute distress Psychiatric:  Patient is competent for consent with normal mood and affect   Cardiovascular:  No pedal edema Respiratory:  No wheezing, non-labored breathing GI:  Abdomen is soft and non-tender Skin:  No lesions in the area of chief complaint Neurologic:  Sensation intact distally Lymphatic:  No axillary or cervical lymphadenopathy  Orthopedic Exam:  Orthopedic examination is limited to the bilateral hip and lower extremity regions.  Skin inspection around both hips is unremarkable.  No swelling, erythema, ecchymosis, abrasions, or other skin abnormalities are identified.  She has tenderness to palpation over the anterior aspects of both hips, as well as over the sacrum.  She has more severe pain with any attempted active or passive motion of either hip.  She has intact sensation to light touch in all distributions of both lower extremities and feet, and has good capillary refill to both feet.  However, she is unable to dorsiflex either ankle or the toes of either foot.  X-rays:  X-rays of the pelvis and right hip, as well as a CT scan of the pelvis are available for review and have been reviewed by myself.  The findings are as described above.  Assessment: Bilateral sacral insufficiency fractures with bilateral superior and inferior pubic rami fractures.  Plan: The treatment options have been discussed with the  patient and her daughter-in-law who was in the room with her.  These fractures can be managed nonsurgically, especially given the fact that the patient was not ambulatory prior to her fall, which would be the patient's preference anyhow.  They understand that the fractures will take a long time to heal and that it will be at least a month before she is reasonably comfortable with standing to transfer and sitting in a wheelchair.  I do feel that she can be mobilized with physical therapy, performing standing for transfers and sitting in a wheelchair as symptoms permit, but that she will need appropriate pain medication.  Thank you for asking me to participate in the care of this delightful woman.  I will be happy to follow her with you.   Pascal Lux, MD  Beeper #:  [phone]  10/03/2018 2:34  PM

## 2018-10-04 ENCOUNTER — Inpatient Hospital Stay
Admit: 2018-10-04 | Discharge: 2018-10-04 | Disposition: A | Discharge status: Home / Self Care | Payer: Medicare Other | Attending: Internal Medicine | Admitting: Internal Medicine

## 2018-10-04 LAB — SARS CORONAVIRUS 2 (TAT 6-24 HRS): SARS Coronavirus 2: NEGATIVE

## 2018-10-04 MED ORDER — ACETAMINOPHEN 325 MG PO TABS: 650.0000 mg | ORAL_TABLET | Freq: Four times a day (QID) | ORAL | Status: DC | PRN

## 2018-10-04 MED ORDER — ENSURE ENLIVE PO LIQD
237.0000 mL | ORAL | Status: DC
  Administered 2018-10-04: 237 mL via ORAL

## 2018-10-04 MED ORDER — SENNOSIDES-DOCUSATE SODIUM 8.6-50 MG PO TABS
1.0000 | ORAL_TABLET | Freq: Two times a day (BID) | ORAL | Status: DC
  Administered 2018-10-04 – 2018-10-06 (×5): 1 via ORAL
  Filled 2018-10-04 (×5): qty 1

## 2018-10-04 MED ORDER — ADULT MULTIVITAMIN W/MINERALS CH
1.0000 | ORAL_TABLET | Freq: Every day | ORAL | Status: DC
  Administered 2018-10-05 – 2018-10-06 (×2): 1 via ORAL
  Filled 2018-10-04 (×2): qty 1

## 2018-10-04 MED ORDER — ACETAMINOPHEN 325 MG PO TABS
650.0000 mg | ORAL_TABLET | Freq: Two times a day (BID) | ORAL | Status: DC
  Administered 2018-10-04 – 2018-10-06 (×5): 650 mg via ORAL
  Filled 2018-10-04 (×4): qty 2

## 2018-10-04 NOTE — Progress Notes (Signed)
*  PRELIMINARY RESULTS* Echocardiogram 2D Echocardiogram has been performed.  Caitlin Solis 10/04/2018, 12:09 PM

## 2018-10-04 NOTE — Progress Notes (Signed)
Family Meeting Note  Advance Directive:yes  Today a meeting took place with the son ( POA).  Patient is unable to participate due NH:AFBXUX capacity hard of hearing.   The following clinical team members were present during this meeting:MD  The following were discussed:Patient's diagnosis: Fall, functional decline, pelvic and sacral fracture, hematoma, Patient's progosis: Unable to determine and Goals for treatment: DNR  Additional follow-up to be provided: Orthopedic  Time spent during discussion:20 minutes  Vaughan Basta, MD

## 2018-10-04 NOTE — Progress Notes (Signed)
[facility] [place] [facility]   PATIENT NAME: Caitlin Solis    MR#:  [id number]  DATE OF BIRTH:  02-Feb-1924  SUBJECTIVE:  CHIEF COMPLAINT:   Chief Complaint  Patient presents with  . Hip Pain   Lives alone with her son and daughter-in-law living nearby.  She is able to walk around in the house at her baseline and uses wheelchair to go out.  Son and daughter-in-law helps with day-to-day food and groceries and they check on her frequently. Had an accidental fall and have multiple pelvic fractures. She was very hard of hearing and could not give me clear answers to my questions  REVIEW OF SYSTEMS:  Due to significant hearing deficit she could not give me review of system.  ROS  DRUG ALLERGIES:   Allergies  Allergen Reactions  . Mirabegron Nausea Only and Nausea And Vomiting    VITALS:  Blood pressure (!) 127/44, pulse 63, temperature 98.1 F (36.7 C), temperature source Oral, resp. rate 16, height 5\' 4"  (1.626 m), weight 54.4 kg, SpO2 98 %.  PHYSICAL EXAMINATION:  GENERAL:  85 y.o.-year-old patient lying in the bed with no acute distress.  EYES: Pupils equal, round, reactive to light and accommodation. No scleral icterus. Extraocular muscles intact.  HEENT: Head atraumatic, normocephalic. Oropharynx and nasopharynx clear.  NECK:  Supple, no jugular venous distention. No thyroid enlargement, no tenderness.  LUNGS: Normal breath sounds bilaterally, no wheezing, rales,rhonchi or crepitation. No use of accessory muscles of respiration.  CARDIOVASCULAR: S1, S2 normal. No murmurs, rubs, or gallops.  ABDOMEN: Soft, nontender, nondistended. Bowel sounds present. No organomegaly or mass.  EXTREMITIES: No pedal edema, cyanosis, or clubbing.  NEUROLOGIC: Cranial nerves II through XII are intact. Muscle strength 3/5 in all extremities.  Not moving much lower extremities due to pain in pelvis sensation intact. Gait not checked.  PSYCHIATRIC: The patient is alert and  oriented x 1.  SKIN: No obvious rash, lesion, or ulcer.   Physical Exam LABORATORY PANEL:   CBC Recent Labs  Lab 10/03/18 0949  WBC 12.4*  HGB 13.6  HCT 41.2  PLT 181   ------------------------------------------------------------------------------------------------------------------  Chemistries  Recent Labs  Lab 10/03/18 1109  NA 137  K 3.9  CL 99  CO2 25  GLUCOSE 156*  BUN 35*  CREATININE 0.98  CALCIUM 9.6   ------------------------------------------------------------------------------------------------------------------  Cardiac Enzymes No results for input(s): TROPONINI in the last 168 hours. ------------------------------------------------------------------------------------------------------------------  RADIOLOGY:  Ct Head Wo Contrast  Result Date: 10/03/2018 CLINICAL DATA:  Head trauma with ataxia EXAM: CT HEAD WITHOUT CONTRAST TECHNIQUE: Contiguous axial images were obtained from the base of the skull through the vertex without intravenous contrast. COMPARISON:  10/11/2013 FINDINGS: Brain: No evidence of acute infarction, hemorrhage, hydrocephalus, extra-axial collection or mass lesion/mass effect. Chronic small vessel ischemia with confluent low-density in the deep cerebral white matter. Moderate atrophy, stable. Vascular: Atherosclerotic calcification. Skull: Negative for acute fracture. Sinuses/Orbits: No evidence of injury. Bilateral cataract resection. IMPRESSION: 1. No acute finding. 2. Atrophy and chronic small vessel ischemia. Electronically Signed   By: Marnee SpringJonathon  Watts M.D.   On: 10/03/2018 14:42   Ct Pelvis Wo Contrast  Result Date: 10/03/2018 CLINICAL DATA:  Pelvic and right hip pain after fall getting out of a bathtub today. Initial encounter. EXAM: CT OF THE PELVIS EXTREMITY WITHOUT CONTRAST TECHNIQUE: Multidetector CT imaging of the pelvis was performed according to the standard protocol. Multiplanar CT image reconstructions were also generated.  COMPARISON:  Single-view of the pelvis 10/26/2011.  FINDINGS: Bones/Joint/Cartilage There are acute bilateral sacral fractures. Left sacral fracture is nondisplaced. There is fragment override of approximately 0.7 cm in the right sacrum. The patient also has acute nondisplaced high bilateral pubic ramus fractures. There is a nondisplaced fracture of the left inferior pubic ramus. Fracture of the right inferior pubic ramus demonstrates 1 shaft width lateral displacement of the anterior fragment and fracture override of 1 cm. No other fracture is identified. Specifically, no hip fracture seen. Hips are located. Moderate degenerative change is present about both hips. Chondrocalcinosis of the left and right labrum is identified. Degenerative change is present at the symphysis pubis. Lower lumbar spine demonstrates loss of disc space height at L4-5 and L5-S1. There is also facet degenerative disease at these levels. Ligaments Suboptimally assessed by CT. Muscles and Tendons There is a hematoma in the inferior aspect of the right gluteus maximus measuring approximately 4.5 cm transverse by 2.8 cm AP by 3 cm craniocaudal. No muscle tear is identified. Soft tissues Imaged intrapelvic contents demonstrate postoperative change of hysterectomy. Infiltration of subcutaneous fat inferior to the ischial tuberosities bilaterally may be due to decubitus ulcers. IMPRESSION: The examination is positive for bilateral sacral and bilateral superior and inferior pubic ramus fractures. Negative for hip fracture. Focal hematoma in the inferior aspect of the right gluteus maximus. Findings suggestive of bilateral decubitus ulcers over the ischial tuberosities. Lower lumbar spondylosis and moderate appearing bilateral hip osteoarthritis. Electronically Signed   By: Inge Rise M.D.   On: 10/03/2018 10:55   Dg Chest Port 1 View  Result Date: 10/03/2018 CLINICAL DATA:  Pt states she had bathed self off and was getting out of tub and  fell onto R side in bathroom. Vomited once at home. Only c/o is R hip pain. No bruising noted at this time. Pt denies taking blood thinners EXAM: PORTABLE CHEST 1 VIEW COMPARISON:  10/09/2010 FINDINGS: The heart is mildly enlarged. There is atherosclerotic calcification of the thoracic aorta. No pneumothorax. No consolidation or edema. There is deformity of numerous ribs, consistent with bilateral remote fractures. Remote fracture of the LEFT clavicle and RIGHT proximal humerus. No definite acute fractures. IMPRESSION: 1. No evidence for acute cardiopulmonary abnormality. 2. Multiple remote fractures. Electronically Signed   By: Nolon Nations M.D.   On: 10/03/2018 10:42   Dg Hip Unilat  With Pelvis 2-3 Views Right  Result Date: 10/03/2018 CLINICAL DATA:  Pt states she had bathed self off and was getting out of tub and fell onto R side in bathroom. Vomited once at home. Only c/o is R hip pain. No bruising noted at this time. Pt denies taking blood thinners. EXAM: DG HIP (WITH OR WITHOUT PELVIS) 2-3V RIGHT COMPARISON:  10/26/2011 FINDINGS: There is a comminuted fracture of the RIGHT superior pubic ramus. There is a fracture of the RIGHT inferior pubic ramus. There are fractures of the LEFT superior and inferior pubic rami. Degenerative changes are seen in both hips. The RIGHT hip is intact. No abnormality identified in the LEFT hip. IMPRESSION: Multiple pelvic fractures. Fractures involving at least the RIGHT superior and inferior pubic rami, LEFT superior and inferior pubic rami. RIGHT hip is intact. Given the mechanism of injury, sacral fractures should be considered but are not seen radiographically. Electronically Signed   By: Nolon Nations M.D.   On: 10/03/2018 10:41    ASSESSMENT AND PLAN:   Active Problems:   Bilateral sacral insufficiency fracture  85 y.o. female  with past medical history of hypertension, hyperparathyroidism, CHF,CAD,  hyperlipidemia, PVD, bradycardia, and osteoporosis  wheelchair bound at baseline presenting to the ED with chief complaints of right hip pain following a fall  1. Pelvic fractures -secondary to mechanical fall - CT pelvis showed bilateral sacral and pubic ramus fractures, there is also focal hematoma in the inferior aspect of the right gluteal maximus - No acute findings on CT of the head. - PRN pain medication - Check UA-still awaited. - need PT/OT consult - Social work/case management consult - Orthopedic consult to Dr. Salomon Mast appreciated, he suggested to manage with pain medication and work on physical therapy and rehab placement.  2. ChronicDiastolicCongestive Heart Failure: BNPmildlyelevated at 150 no evidence of exacerbation - Chest x-ray shows no pulmonary vascular congestion - Last Echo7/2018 , EF 55% - Continue lisinopril and Coreg - Hold Lasix for now - Echocardiogram pending - Low salt diet - Check daily weight - Strict I&Os  3. Atrial fibrillation-noted on monitor, no history of A. fib likely new onset -Following with Dr. Gwen Pounds Heart rate is under control now.  4. Coronary Artery Disease  -Hold ASA in the setting of focal hematoma May start in 2 to 3 days.  5. HLD  + Goal LDL<100  6. HTN  + Goal BP <130/80 -Continue lisinopril, hydralazine, Coreg and amlodipine  7. DVT prophylaxis - Hold anti-coagulation for hematoma -May start in 3 to 4 days if patient is stable     All the records are reviewed and case discussed with Care Management/Social Workerr. Management plans discussed with the patient, family and they are in agreement.  CODE STATUS: DNR  TOTAL TIME TAKING CARE OF THIS PATIENT: 35 minutes.   I spoke to patient's son on the phone.  POSSIBLE D/C IN 1-2 DAYS, DEPENDING ON CLINICAL CONDITION.   Altamese Dilling M.D on 10/04/2018   Between 7am to 6pm - Pager - [phone]  After 6pm go to [email or web address] - password [email or web address]  [facility]  Office   [phone]  CC: Primary care physician; Patient, No Pcp Per  Note: This dictation was prepared with Dragon dictation along with smaller phrase technology. Any transcriptional errors that result from this process are unintentional.

## 2018-10-04 NOTE — Progress Notes (Signed)
Subjective: The patient notes little improvement in her symptoms as compared to yesterday.  She is comfortable at rest, but has significant pain when she tries to move.  Physical therapy apparently tried to sit her up today at the bedside and she could not tolerate this, according to her daughter in law.   Objective: Vital signs in last 24 hours: Temp:  [97.9 F (36.6 C)-98.3 F (36.8 C)] 98.1 F (36.7 C) (09/07 1417) Pulse Rate:  [61-73] 63 (09/07 1417) Resp:  [16-24] 16 (09/07 1417) BP: (127-163)/(44-79) 127/44 (09/07 1417) SpO2:  [96 %-99 %] 98 % (09/07 1417)  Intake/Output from previous day: 09/06 0701 - 09/07 0700 In: -  Out: 600 [Urine:600] Intake/Output this shift: Total I/O In: 480 [P.O.:480] Out: -   Recent Labs    10/03/18 0949  HGB 13.6   Recent Labs    10/03/18 0949  WBC 12.4*  RBC 4.47  HCT 41.2  PLT 181   Recent Labs    10/03/18 1109  NA 137  K 3.9  CL 99  CO2 25  BUN 35*  CREATININE 0.98  GLUCOSE 156*  CALCIUM 9.6   No results for input(s): LABPT, INR in the last 72 hours.  Physical Exam: Her physical examination findings as they pertain to her bilateral hip and lower extremities is unchanged from yesterday's exam.  Assessment: Bilateral sacral insufficiency fractures with bilateral superior and inferior pubic rami fractures.  Plan: Continue to the mobilize the patient as tolerated, providing appropriate pain medications when needed.  She will need to have strict decubitus precautions, given her age and how slow she will progress with mobilization.   Caitlin Solis 10/04/2018, 3:21 PM

## 2018-10-04 NOTE — Progress Notes (Signed)
Physical Therapy Evaluation Patient Details Name: Caitlin Solis MRN: [id number] DOB: March 13, 1924 Today's Date: 10/04/2018   History of Present Illness  Patient is an 85 year old female with history of hypertension, hyperlipidemia, CAD and peripheral vascular disease who presented to the emergency room with pain following a mechanical fall and sustained pelvic fractures.   Clinical Impression  Patient had pain medicine prior to PT evaluation. She had severe pain with any mobility. She has weakness in BLE 2/5 hips. She is not able to tolerate sitting at the eOB; and is unable to attempt transfer sit to stand.  She performs supine<> sit with max assist and 10/10 pain to pelvis. She does not tolerate any movement or mobility due to pain. She will benefit from skilled PT to improve strength and mobility.     Follow Up Recommendations SNF    Equipment Recommendations  None recommended by PT    Recommendations for Other Services       Precautions / Restrictions Precautions Precautions: Fall Restrictions Other Position/Activity Restrictions: Dr Nicholaus Bloom note specifies "can be mobilized with physical therapy, performing standing for transfers and sitting in a wheelchair as symptoms permit"      Mobility  Bed Mobility Overal bed mobility: Needs Assistance Bed Mobility: Rolling;Supine to Sit;Sit to Supine Rolling: Total assist   Supine to sit: Max assist Sit to supine: Total assist   General bed mobility comments: needs cues for hand placement  Transfers Overall transfer level: (unable to stay in sitting position due to pain)               General transfer comment: unable to perform  Ambulation/Gait                Stairs            Wheelchair Mobility    Modified Rankin (Stroke Patients Only)       Balance Overall balance assessment: Mild deficits observed, not formally tested                                           Pertinent  Vitals/Pain Pain Assessment: 0-10 Pain Score: 10-Worst pain ever Faces Pain Scale: Hurts whole lot Pain Location: lower back/sacral area, does not appear to be in a lot of pain (4/10) when sitting still, but easily 8/10 with attempts to even passively mobilize LEs. Pain Descriptors / Indicators: Aching;Grimacing;Guarding;Throbbing Pain Intervention(s): Limited activity within patient's tolerance;Monitored during session;Premedicated before session    [place] expects to be discharged to:: Private residence Living Arrangements: Alone Available Help at Discharge: Family Type of Home: House Home Access: [place]: One level;Laundry or work area in basement;Able to live on main level with bedroom/bathroom Home Equipment: Environmental consultant - 2 wheels;Bedside commode;Shower seat;Grab bars - toilet;Grab bars - tub/shower;Wheelchair - manual      Prior Function Level of Independence: Independent with assistive device(s)         Comments: Pt's dtr in law present and reports that she is checked on multiple times a day by family, but declines assistance with most aspects of ADLs/IADLs despite family's attempts. Pt uses w/c for fxl mobility, scoots up and down basement steps to do laundry (has second w/c at bottom of stairs apparently). Family does not allow pt to cook any more, but prepare meals that she can microwave. Pt was transferring herself from wheelchair<>bed,  commode, shower chair, and couch. Family reports >10 falls in past year.     Hand Dominance        Extremity/Trunk Assessment   Upper Extremity Assessment Upper Extremity Assessment: Defer to OT evaluation RUE Deficits / Details: shoulder, elbow and digit flex/ext 4-/5 LUE Deficits / Details: shoulder, elbow and digit flex/ext 4-/5    Lower Extremity Assessment Lower Extremity Assessment: Generalized weakness RLE: Unable to fully assess due to pain LLE: Unable to fully assess due to pain        Communication   Communication: HOH;Other (comment)  Cognition Arousal/Alertness: Awake/alert Behavior During Therapy: WFL for tasks assessed/performed Overall Cognitive Status: Within Functional Limits for tasks assessed                                 General Comments: Pt answers orientation questions appropriately, but has short, intermittent periods of confusion per dtr-in-law (ex: stated the hysterectomy was killing her although this procedure happened several years ago).      General Comments General comments (skin integrity, edema, etc.): defer    Exercises Other Exercises Other Exercises: engages pt and daughter-in-law re: Role of OT in this setting, d/c recommendations for rehab. Pt and dtr-in-law verbalize understanding. Ask appropriate questions including about length of stay, which OT explains will depend on pt's recovery and healing and varies from person to person. Other Exercises: OT engages pt and dtr in law in general safety and fall prevention education-asking for assitance, use of call bell. Both parties verbalize understanding.   Assessment/Plan    PT Assessment Patient needs continued PT services  PT Problem List         PT Treatment Interventions Gait training;Functional mobility training;Therapeutic activities;Therapeutic exercise;Balance training    PT Goals (Current goals can be found in the Care Plan section)  Acute Rehab PT Goals Patient Stated Goal: To get home as soon as I can PT Goal Formulation: With patient/family Potential to Achieve Goals: Good    Frequency 7X/week   Barriers to discharge Decreased caregiver support(poor mobility level)      Co-evaluation               AM-PAC PT "6 Clicks" Mobility  Outcome Measure Help needed turning from your back to your side while in a flat bed without using bedrails?: Total Help needed moving from lying on your back to sitting on the side of a flat bed without using  bedrails?: Total Help needed moving to and from a bed to a chair (including a wheelchair)?: Total Help needed standing up from a chair using your arms (e.g., wheelchair or bedside chair)?: Total Help needed to walk in hospital room?: Total Help needed climbing 3-5 steps with a railing? : Total 6 Click Score: 6    End of Session Equipment Utilized During Treatment: Gait belt Activity Tolerance: Patient limited by fatigue;Patient limited by lethargy;Patient limited by pain Patient left: in bed;with call bell/phone within reach        Time: 1340-1355 PT Time Calculation (min) (ACUTE ONLY): 15 min   Charges:   PT Evaluation $PT Eval Low Complexity: 1 Low          [place], Sharion SettlerKristine S, PT DPT 10/04/2018, 2:53 PM

## 2018-10-04 NOTE — Progress Notes (Signed)
OT Cancellation Note  Patient Details Name: Caitlin Solis MRN: [id number] DOB: 04-26-1924   Cancelled Treatment:    Reason Eval/Treat Not Completed: Active bedrest order Chart review completed and active bedrest order noted to still be present. OT conferred with nursing who offers to get further clarification from physician. Will follow up as available for OT evaluation.   Gerrianne Scale, MS, OTR/L ascom [phone] or [phone] 10/04/18, 9:40 AM

## 2018-10-04 NOTE — Progress Notes (Signed)
Ch went to visit pt to provide AD education but pt was resting. Ch contacted the son Caitlin Solis) regarding the HPOA for the pt via telephone. Son shared that he would like to look over the information with the pt. Ch shared that she would provide an AD document for the pt to view. Ch also shared the limitations of not being able to finalize the document due to COVID-19 visitation restrictions. Call was appreciated.  No further needs at this time.     10/04/18 1000  Clinical Encounter Type  Visited With Patient;Family  Visit Type Other (Comment) (AD education )  Referral From Physician  Consult/Referral To Chaplain  Stress Factors  Patient Stress Factors None identified  Family Stress Factors None identified  Advance Directives (For Healthcare)  Does Patient Have a Medical Advance Directive? No  Would patient like information on creating a medical advance directive? Yes (Inpatient - patient defers creating a medical advance directive at this time - Information given)

## 2018-10-04 NOTE — Evaluation (Signed)
Occupational Therapy Evaluation Patient Details Name: Caitlin GRAMBO MRN: [id number] DOB: 1924-07-21 Today's Date: 10/04/2018    History of Present Illness Pt is 85 y/o F with PMH HTN who presented to ED following mechanical fall in bathroom at home when attempting to transfer from tub back to wheelchair after bathing. Pt sustained bilateral sacral insufficiency fractures and bilateral pubic rami fractures (R displaced more than L). Fx's managed non-surgically at this time. Of note: Pt is non-ambulatory at baseline d/t bilateral foot drop, using w/c for all fxl mobility.   Clinical Impression   Pt seen for OT evaluation this date. Prior to hospital admission, pt was MOD I with BADLs and family assisted with most IADLs such as cooking (pt able to re-heat meals), cleaning and transportation to appointments. Of note: pt was still doing her own laundry and declining family assistance despite being w/c bound secondary to foot drop and laundry equipment in basement. Daughter-in-law, who is present on evaluation, reports that pt scooted on her bottom up and down steps with second w/c located in basement that she would sit in for laundry duties.  Pt lives in [facility] with basement and ramped entrance with grab bars in restroom and performed all ADL transfers independently. However, family endorses several falls in past year.  Currently pt demonstrates impairments in all aspects fxl mobility including bed mobility. Requiring total assist, or not tolerating at all, and requires total assistance with all LB ADLs, can perform some UB ADLs with setup at bed level, but cannot tolerate sitting EOB on OT eval secondary to pain (RN administers pain medicine towards end of OT session which OT reports to PT to coordinate eval).  Pt would benefit from skilled OT to address noted impairments and functional limitations (see below for any additional details) in order to maximize safety and independence while minimizing falls risk and  caregiver burden.  Upon hospital discharge, recommend pt discharge to SNF.    Follow Up Recommendations  SNF    Equipment Recommendations  Other (comment)(defer to next venue of care)    Recommendations for Other Services       Precautions / Restrictions Precautions Precautions: Fall Restrictions Other Position/Activity Restrictions: Dr Binnie Rail note specifies "can be mobilized with physical therapy, performing standing for transfers and sitting in a wheelchair as symptoms permit"      Mobility Bed Mobility Overal bed mobility: Needs Assistance Bed Mobility: Rolling Rolling: Total assist         General bed mobility comments: OT attempts to engage pt in lateral rolling, but pt unable to tolerate at this time, second person to assist would be ideal given pt's pain level  Transfers                 General transfer comment: defer    Balance                                           ADL either performed or assessed with clinical judgement   ADL Overall ADL's : Needs assistance/impaired Eating/Feeding: Set up;Bed level   Grooming: Wash/dry hands;Wash/dry face;Oral care;Set up;Bed level   Upper Body Bathing: Set up;Bed level   Lower Body Bathing: Total assistance;Bed level   Upper Body Dressing : Set up;Bed level   Lower Body Dressing: Total assistance;Bed level   Toilet Transfer: Total assistance Toilet Transfer Details (indicate cue type and reason): use  of bed pan, brief and external catheter at this time d/t limited tolerance secondary to pain Toileting- Clothing Manipulation and Hygiene: Bed level;Maximal assistance     Tub/Shower Transfer Details (indicate cue type and reason): N/A at this time, only tolerating bathing at bed level Functional mobility during ADLs: (unable to tolerating sitting up at time of OT eval, t/f to armchair/wheelchair not performed at this time 2/2 pain)       Vision Baseline Vision/History: Wears  glasses Wears Glasses: At all times(doesn't have with her) Patient Visual Report: No change from baseline       Perception     Praxis      Pertinent Vitals/Pain Pain Assessment: Faces Faces Pain Scale: Hurts whole lot Pain Location: lower back/sacral area, does not appear to be in a lot of pain (4/10) when sitting still, but easily 8/10 with attempts to even passively mobilize LEs. Pain Descriptors / Indicators: Aching;Grimacing;Guarding;Throbbing Pain Intervention(s): Limited activity within patient's tolerance;Monitored during session;Patient requesting pain meds-RN notified;RN gave pain meds during session(RN gave meds near end of OT session)     Hand Dominance     Extremity/Trunk Assessment Upper Extremity Assessment Upper Extremity Assessment: RUE deficits/detail;LUE deficits/detail RUE Deficits / Details: shoulder, elbow and digit flex/ext 4-/5 LUE Deficits / Details: shoulder, elbow and digit flex/ext 4-/5   Lower Extremity Assessment Lower Extremity Assessment: Defer to PT evaluation;Generalized weakness;RLE deficits/detail;LLE deficits/detail RLE: Unable to fully assess due to pain LLE: Unable to fully assess due to pain       Communication Communication Communication: HOH;Other (comment)(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   General Comments  defer    Exercises Other Exercises Other Exercises: engages pt and daughter-in-law re: Role of OT in this setting, d/c recommendations for rehab. Pt and  dtr-in-law verbalize understanding. Ask appropriate questions including about length of stay, which OT explains will depend on pt's recovery and healing and varies from person to person. Other Exercises: OT engages pt and dtr in law in general safety and fall prevention education-asking for assitance, use of call bell. Both parties verbalize understanding.   Shoulder Instructions      Home Living Family/patient expects to be discharged to:: Private residence Living Arrangements: Alone Available Help at Discharge: Family Type of Home: House Home Access: Ramped entrance     Home Layout: One level;Laundry or work area in basement;Able to live on main level with bedroom/bathroom     Bathroom Shower/Tub: Chief Strategy OfficerTub/shower unit   Bathroom Toilet: Handicapped height     Home Equipment: Environmental consultantWalker - 2 wheels;Bedside commode;Shower seat;Grab bars - toilet;Grab bars - tub/shower;Wheelchair - manual          Prior Functioning/Environment Level of Independence: Independent with assistive device(s)        Comments: Pt's dtr in law present and reports that she is checked on multiple times a day by family, but declines assistance with most aspects of ADLs/IADLs despite family's attempts. Pt uses w/c for fxl mobility, scoots  up and down basement steps to do laundry (has second w/c at bottom of stairs apparently). Family does not allow pt to cook any more, but prepare meals that she can microwave. Pt was transferring herself from wheelchair<>bed, commode, shower chair, and couch. Family reports >10 falls in past year.        OT Problem List: Decreased strength;Decreased range of motion;Decreased activity tolerance;Impaired balance (sitting and/or standing);Decreased safety awareness;Decreased knowledge of use of DME or AE;Decreased knowledge of precautions;Pain      OT Treatment/Interventions: Self-care/ADL training;Therapeutic exercise;DME and/or AE instruction;Therapeutic activities;Patient/family  education    OT Goals(Current goals can be found in the care plan section) Acute Rehab OT Goals Patient Stated Goal: To get home as soon as I can OT Goal Formulation: With patient/family Time For Goal Achievement: 10/18/18 Potential to Achieve Goals: Fair  OT Frequency: Min 1X/week   Barriers to D/C:            Co-evaluation              AM-PAC OT "6 Clicks" Daily Activity     Outcome Measure Help from another person eating meals?: None Help from another person taking care of personal grooming?: A Little Help from another person toileting, which includes using toliet, bedpan, or urinal?: Total Help from another person bathing (including washing, rinsing, drying)?: A Lot Help from another person to put on and taking off regular upper body clothing?: A Little Help from another person to put on and taking off regular lower body clothing?: Total 6 Click Score: 14   End of Session Nurse Communication: Mobility status;Patient requests pain meds(in addition, OT requests SCDs as pt's dtr-in-law with concerns about clotting, pt with LE foot drop, and order for SCDs present, RN confirms.)  Activity Tolerance: Patient limited by pain Patient left: in bed;with call bell/phone within reach;with bed alarm set;with family/visitor present  OT Visit Diagnosis: Muscle weakness (generalized) (M62.81);History of falling (Z91.81);Pain Pain - Right/Left: Right Pain - part of body: (sacrum/lower back area)                Time: 6761-9509 OT Time Calculation (min): 35 min Charges:  OT General Charges $OT Visit: 1 Visit OT Treatments $Self Care/Home Management : 8-22 mins $Therapeutic Activity: 8-22 mins  Gerrianne Scale, MS, OTR/L ascom [phone] or [phone] 10/04/18, 2:19 PM

## 2018-10-04 NOTE — Progress Notes (Addendum)
Initial Nutrition Assessment  DOCUMENTATION CODES:   Not applicable  INTERVENTION:  Recommend liberalizing diet to regular.  Provide Ensure Enlive po once daily, each supplement provides 350 kcal and 20 grams of protein.  Provide daily MVI.  Encouraged adequate intake of calories and protein at meals. Discussed increased needs for healing.  NUTRITION DIAGNOSIS:   Inadequate oral intake related to decreased appetite as evidenced by per patient/family report.  GOAL:   Patient will meet greater than or equal to 90% of their needs  MONITOR:   PO intake, Supplement acceptance, Labs, Weight trends, I & O's  REASON FOR ASSESSMENT:   Consult Assessment of nutrition requirement/status  ASSESSMENT:   85 year old female with PMHx of HTN who is admitted after a fall with bilateral superior and inferior pubic rami fractures (plan for non-surgical management).   Met with patient and her daughter-in-law at bedside. Patient is somewhat hard of hearing but is a good historian. Patient lives independently at home but her son and daughter-in-law come over often to check on her and bring her food (pt does not cook anymore). Patient and daughter-in-law both report that patient has a very good appetite and intake at baseline. She eats 2-3 well-balanced meals daily and also reports that she enjoys eating ice cream. Patient reports her appetite has not been as good here in the hospital and she is eating less. She ate 30% of her breakfast and then had about 50% of her lunch. She is amenable to drinking Ensure to help meet her calorie/protein needs. Patient feels like she could drink one bottle per day.  Patient reports her UBW is 118 lbs and that she is weight-stable. Current weight is 54.4 kg (120 lbs). Patient with muscle and fat wasting, but hesitant to give nutrition diagnosis of malnutrition as patient and daughter-in-law reports good appetite/intake at baseline and that patient is  weight-stable.  Medications reviewed and include: Oscal with D 1 tablet daily, carvedilol, vitamin D3 1000 units daily, lisinopril, potassium chloride 10 mEq daily, senna-docusate 1 tablet BID.  Labs reviewed: BUN 35.  NUTRITION - FOCUSED PHYSICAL EXAM:    Most Recent Value  Orbital Region  Severe depletion  Upper Arm Region  Severe depletion  Thoracic and Lumbar Region  Moderate depletion  Buccal Region  Severe depletion  Temple Region  Severe depletion  Clavicle Bone Region  Severe depletion  Clavicle and Acromion Bone Region  Severe depletion  Scapular Bone Region  Unable to assess  Dorsal Hand  Severe depletion  Patellar Region  Moderate depletion  Anterior Thigh Region  Moderate depletion  Posterior Calf Region  Moderate depletion  Edema (RD Assessment)  None  Hair  Reviewed  Eyes  Reviewed  Mouth  Reviewed  Skin  Reviewed  Nails  Reviewed     Diet Order:   Diet Order            Diet Heart Room service appropriate? Yes; Fluid consistency: Thin  Diet effective now             EDUCATION NEEDS:   Education needs have been addressed  Skin:  Skin Assessment: Reviewed RN Assessment  Last BM:  10/03/2018 per chart  Height:   Ht Readings from Last 1 Encounters:  10/03/18 '5\' 4"'  (1.626 m)   Weight:   Wt Readings from Last 1 Encounters:  10/03/18 54.4 kg   Ideal Body Weight:  54.5 kg  BMI:  Body mass index is 20.6 kg/m.  Estimated Nutritional Needs:  Kcal:  1350-1550  Protein:  65-75 grams  Fluid:  1.3-1.5 L/day  Willey Blade, MS, RD, LDN Office: [phone] Pager: [phone] After Hours/Weekend Pager: [phone]

## 2018-10-05 LAB — CBC
HCT: 32.9 % — ABNORMAL LOW (ref 36.0–46.0)
Hemoglobin: 11 g/dL — ABNORMAL LOW (ref 12.0–15.0)
MCH: 30.6 pg (ref 26.0–34.0)
MCHC: 33.4 g/dL (ref 30.0–36.0)
MCV: 91.6 fL (ref 80.0–100.0)
Platelets: 113 10*3/uL — ABNORMAL LOW (ref 150–400)
RBC: 3.59 MIL/uL — ABNORMAL LOW (ref 3.87–5.11)
RDW: 14.5 % (ref 11.5–15.5)
WBC: 11.1 10*3/uL — ABNORMAL HIGH (ref 4.0–10.5)
nRBC: 0 % (ref 0.0–0.2)

## 2018-10-05 LAB — ECHOCARDIOGRAM COMPLETE
Height: 64 in
Weight: 1920 oz

## 2018-10-05 MED ORDER — HYDROCODONE-ACETAMINOPHEN 5-325 MG PO TABS: 1.0000 | ORAL_TABLET | Freq: Four times a day (QID) | ORAL | 0 refills | Status: AC | PRN

## 2018-10-05 NOTE — Progress Notes (Signed)
Occupational Therapy Treatment Patient Details Name: Caitlin Solis MRN: [id number] DOB: [birth year] Today's Date: 10/05/2018    History of present illness Patient is an 85 year old female with history of hypertension, hyperlipidemia, CAD and peripheral vascular disease who presented to the emergency room with pain following a mechanical fall and sustained pelvic fractures.    OT comments  Pt seen for OT tx this date. Pt denies pain at rest but reports significant pelvic pain with movement. +2 to scoot up in bed for improved positioning of hips in bed. Pt instructed in ther-ex with yellow theraband for shoulder flexion, horizontal abduction, and elbow flexion, 2x5 per ex with rest breaks in between 2/2 pt feeling SOB (vitals WNL). Pt progressing slowly towards goals, continues to be pain limited. Continues to benefit from skilled OT Services. Continue to recommend SNF.    Follow Up Recommendations  SNF    Equipment Recommendations  3 in 1 bedside commode    Recommendations for Other Services      Precautions / Restrictions Precautions Precautions: Fall Restrictions Other Position/Activity Restrictions: Dr Nicholaus Bloom note specifies "can be mobilized with physical therapy, performing standing for transfers and sitting in a wheelchair as symptoms permit"       Mobility Bed Mobility               General bed mobility comments: max +2 for scooting up in bed  Transfers                      Balance                                           ADL either performed or assessed with clinical judgement   ADL                                               Vision Baseline Vision/History: Wears glasses Wears Glasses: At all times(doesn't have with her) Patient Visual Report: No change from baseline     Perception     Praxis      Cognition Arousal/Alertness: Awake/alert Behavior During Therapy: WFL for tasks  assessed/performed Overall Cognitive Status: Within Functional Limits for tasks assessed                                          Exercises Other Exercises Other Exercises: ther-ex with yellow theraband for shoulder flexion, horizontal abduction, and elbow flexion, 2x5 per ex with rest breaks in between 2/2 pt feeling SOB (vitals WNL)   Shoulder Instructions       General Comments      Pertinent Vitals/ Pain       Pain Assessment: Faces Faces Pain Scale: Hurts even more Pain Location: sacral/pelvic pain with bed mobility, otherwise no pain at rest Pain Descriptors / Indicators: Aching;Grimacing;Guarding Pain Intervention(s): Limited activity within patient's tolerance;Monitored during session;Repositioned  Home Living                                          Prior Functioning/Environment  Frequency  Min 1X/week        Progress Toward Goals  OT Goals(current goals can now be found in the care plan section)  Progress towards OT goals: OT to reassess next treatment  Acute Rehab OT Goals Patient Stated Goal: To get home as soon as I can OT Goal Formulation: With patient/family Time For Goal Achievement: 10/18/18 Potential to Achieve Goals: Fair  Plan Discharge plan remains appropriate;Frequency remains appropriate    Co-evaluation                 AM-PAC OT "6 Clicks" Daily Activity     Outcome Measure   Help from another person eating meals?: None Help from another person taking care of personal grooming?: A Little Help from another person toileting, which includes using toliet, bedpan, or urinal?: Total Help from another person bathing (including washing, rinsing, drying)?: A Lot Help from another person to put on and taking off regular upper body clothing?: A Little Help from another person to put on and taking off regular lower body clothing?: Total 6 Click Score: 14    End of Session    OT Visit  Diagnosis: Muscle weakness (generalized) (M62.81);History of falling (Z91.81);Pain Pain - Right/Left: Right Pain - part of body: (sacrum/pelvis)   Activity Tolerance Patient limited by pain   Patient Left in bed;with call bell/phone within reach;with bed alarm set   Nurse Communication          Time: [id number] OT Time Calculation (min): 23 min  Charges: OT General Charges $OT Visit: 1 Visit OT Treatments $Therapeutic Exercise: 23-37 mins  Richrd PrimeJamie Stiller, MPH, MS, OTR/L ascom [phone] 10/05/18, 10:42 AM

## 2018-10-05 NOTE — TOC Progression Note (Signed)
Transition of Care [facility]) - Progression Note    Patient Details  Name: Caitlin Solis MRN: [id number] Date of Birth: [birth year]  Transition of [facility]) CM/SW Contact  Shelbie Hutching, RN Phone Number: 10/05/2018, 1:50 PM  Clinical Narrative:     [name] has offered a bed and patient has accepted.  Patient will discharge to [facility].    Expected Discharge Plan: [place] Barriers to Discharge: Continued Medical Work up  Expected Discharge Plan and Services Expected Discharge Plan: [place]   Discharge Planning Services: CM Consult Post Acute Care Choice: [place] arrangements for the past 2 months: Single Family Home Expected Discharge Date: 10/05/18                                     Social Determinants of Health (SDOH) Interventions    Readmission Risk Interventions No flowsheet data found.

## 2018-10-05 NOTE — NC FL2 (Signed)
[place] LEVEL OF CARE SCREENING TOOL     IDENTIFICATION  Patient Name: Caitlin Solis Birthdate: [birth year] Sex: female Admission Date (Current Location): 10/03/2018  [place] and [place] Number:  Engineering geologist and Address:  [facility], [address] [place], [place] [address]      Provider Number: [id number]  Attending Physician Name and Address:  Demetrios Loll, MD  Relative Name and Phone Number:  Reyna Lorenzi [phone]    Current Level of Care: Hospital Recommended Level of Care: [place] Prior Approval Number:    Date Approved/Denied:   PASRR Number: [id number]  Discharge Plan: SNF    Current Diagnoses: Patient Active Problem List   Diagnosis Date Noted  . Bilateral sacral insufficiency fracture 10/03/2018    Orientation RESPIRATION BLADDER Height & Weight     Self, Time, Situation, Place  Normal External catheter, Incontinent Weight: 54.4 kg Height:  5\' 4"  (162.6 cm)  BEHAVIORAL SYMPTOMS/MOOD NEUROLOGICAL BOWEL NUTRITION STATUS      Continent Diet  AMBULATORY STATUS COMMUNICATION OF NEEDS Skin   Total Care Verbally Bruising                       Personal Care Assistance Level of Assistance  Bathing, Feeding, Dressing Bathing Assistance: Maximum assistance Feeding assistance: Limited assistance Dressing Assistance: Maximum assistance     Functional Limitations Info             SPECIAL CARE FACTORS FREQUENCY  PT (By licensed PT), OT (By licensed OT)     PT Frequency: 5 times per week OT Frequency: 5 times per week            Contractures Contractures Info: Not present    Additional Factors Info  Code Status, Allergies Code Status Info: DNR Allergies Info: Mirabegron           Current Medications (10/05/2018):  This is the current hospital active medication list Current Facility-Administered Medications  Medication Dose Route Frequency Provider Last Rate Last  Dose  . acetaminophen (TYLENOL) tablet 650 mg  650 mg Oral Q6H PRN Vaughan Basta, MD      . acetaminophen (TYLENOL) tablet 650 mg  650 mg Oral BID Vaughan Basta, MD   650 mg at 10/04/18 2052  . amLODipine (NORVASC) tablet 5 mg  5 mg Oral Daily Lang Snow, NP   5 mg at 10/04/18 1335  . calcium-vitamin D (OSCAL WITH D) 500-200 MG-UNIT per tablet 1 tablet  1 tablet Oral Daily Lang Snow, NP   1 tablet at 10/04/18 1334  . carvedilol (COREG) tablet 25 mg  25 mg Oral BID Lang Snow, NP   25 mg at 10/04/18 1335  . cholecalciferol (VITAMIN D3) tablet 1,000 Units  1,000 Units Oral Daily Lang Snow, NP   1,000 Units at 10/04/18 1335  . feeding supplement (ENSURE ENLIVE) (ENSURE ENLIVE) liquid 237 mL  237 mL Oral Q24H Vaughan Basta, MD   237 mL at 10/04/18 1532  . hydrALAZINE (APRESOLINE) tablet 25 mg  25 mg Oral BID Lang Snow, NP   25 mg at 10/04/18 1334  . HYDROcodone-acetaminophen (NORCO/VICODIN) 5-325 MG per tablet 1-2 tablet  1-2 tablet Oral Q6H PRN Lang Snow, NP      . lisinopril (ZESTRIL) tablet 40 mg  40 mg Oral Daily Lang Snow, NP   40 mg at 10/04/18 1335  . morphine 2 MG/ML injection 0.5 mg  0.5  mg Intravenous Q2H PRN Jimmye Normanuma, Elizabeth Achieng, NP   0.5 mg at 10/04/18 1334  . multivitamin with minerals tablet 1 tablet  1 tablet Oral Daily Altamese DillingVachhani, Vaibhavkumar, MD      . potassium chloride SA (K-DUR) CR tablet 10 mEq  10 mEq Oral Daily Jimmye Normanuma, Elizabeth Achieng, NP   10 mEq at 10/04/18 1334  . senna-docusate (Senokot-S) tablet 1 tablet  1 tablet Oral BID Altamese DillingVachhani, Vaibhavkumar, MD   1 tablet at 10/04/18 2052     Discharge Medications: Please see discharge summary for a list of discharge medications.  Relevant Imaging Results:  Relevant Lab Results:   Additional Information SSN [id number]  Allayne ButcherJeanna M Kersten Salmons, RN

## 2018-10-05 NOTE — Progress Notes (Signed)
Caitlin Solis at [place] NAME: Caitlin Solis    MR#:  [id number]  DATE OF BIRTH:  August 20, 1924  SUBJECTIVE:  CHIEF COMPLAINT:   Chief Complaint  Patient presents with  . Hip Pain   The patient complains of pain on movement.  REVIEW OF SYSTEMS:  Due to significant hearing deficit she could not give me review of system.  ROS  DRUG ALLERGIES:   Allergies  Allergen Reactions  . Mirabegron Nausea Only and Nausea And Vomiting    VITALS:  Blood pressure (!) 122/50, pulse 67, temperature 98.4 F (36.9 C), resp. rate 18, height 5\' 4"  (1.626 m), weight 54.4 kg, SpO2 98 %.  PHYSICAL EXAMINATION:  GENERAL:  85 y.o.-year-old patient lying in the bed with no acute distress.  EYES: Pupils equal, round, reactive to light and accommodation. No scleral icterus. Extraocular muscles intact.  HEENT: Head atraumatic, normocephalic. NECK:  Supple, no jugular venous distention. No thyroid enlargement, no tenderness.  LUNGS: Normal breath sounds bilaterally, no wheezing, rales,rhonchi or crepitation. No use of accessory muscles of respiration.  CARDIOVASCULAR: S1, S2 normal. No murmurs, rubs, or gallops.  ABDOMEN: Soft, nontender, nondistended. Bowel sounds present. No organomegaly or mass.  EXTREMITIES: No pedal edema, cyanosis, or clubbing.  NEUROLOGIC: Cranial nerves II through XII are intact. Muscle strength 3/5 in all extremities.  Not moving much lower extremities due to pain in pelvis sensation intact. Gait not checked.  PSYCHIATRIC: The patient is alert and oriented x 1.  SKIN: No obvious rash, lesion, or ulcer.   Physical Exam LABORATORY PANEL:   CBC Recent Labs  Lab 10/05/18 0553  WBC 11.1*  HGB 11.0*  HCT 32.9*  PLT 113*   ------------------------------------------------------------------------------------------------------------------  Chemistries  Recent Labs  Lab 10/03/18 1109  NA 137  K 3.9  CL 99  CO2 25  GLUCOSE 156*  BUN  35*  CREATININE 0.98  CALCIUM 9.6   ------------------------------------------------------------------------------------------------------------------  Cardiac Enzymes No results for input(s): TROPONINI in the last 168 hours. ------------------------------------------------------------------------------------------------------------------  RADIOLOGY:  No results found.  ASSESSMENT AND PLAN:   Active Problems:   Bilateral sacral insufficiency fracture  85 y.o. female  with past medical history of hypertension, hyperparathyroidism, CHF,CAD, hyperlipidemia, PVD, bradycardia, and osteoporosis wheelchair bound at baseline presenting to the ED with chief complaints of right hip pain following a fall  1. Pelvic fractures -secondary to mechanical fall - CT pelvis showed bilateral sacral and pubic ramus fractures, there is also focal hematoma in the inferior aspect of the right gluteal maximus - No acute findings on CT of the head. - PRN pain medication - Check UA-still awaited.  PT/OT consult - Social work/case management consult - Orthopedic consult to Dr. Leslye Solis appreciated, he suggested to manage with pain medication and work on physical therapy and rehab placement.  2. ChronicDiastolicCongestive Heart Failure: BNPmildlyelevated at 150 no evidence of exacerbation - Chest x-ray shows no pulmonary vascular congestion - Last Echo7/2018 , EF 55%. Echo this time: The left ventricle has normal systolic function, with an ejection fraction of 55-60%. - Continue lisinopril and Coreg - Hold Lasix for now - Low salt diet - Check daily weight - Strict I&Os  3. Atrial fibrillation-noted on monitor, no history of A. fib likely new onset -Following with Dr. Nehemiah Solis Heart rate is under control now.  4. Coronary Artery Disease  -Hold ASA in the setting of focal hematoma May resume.  5. HLD  + Goal LDL<100  6. HTN  + Goal BP <  130/80 -Continue lisinopril, hydralazine, Coreg  and amlodipine  7. DVT prophylaxis - Hold anti-coagulation for hematoma -May start in 3 to 4 days if patient is stable.  PT evaluation suggest skilled nursing facility.  All the records are reviewed and case discussed with Care Management/Social Workerr. Management plans discussed with the patient's daughter-in-law and they are in agreement.  CODE STATUS: DNR  TOTAL TIME TAKING CARE OF THIS PATIENT: 32 minutes.   I spoke to patient's son on the phone.  POSSIBLE D/C IN 1-2 DAYS, DEPENDING ON CLINICAL CONDITION.   Caitlin PollackQing Anetra Solis M.D on 10/05/2018   Between 7am to 6pm - Pager - [phone]  After 6pm go to [email or web address] - password [email or web address]  [facility]  Office  [phone]  CC: Primary care physician; Patient, No Pcp Per  Note: This dictation was prepared with Dragon dictation along with smaller phrase technology. Any transcriptional errors that result from this process are unintentional.

## 2018-10-05 NOTE — Progress Notes (Signed)
Subjective: Patient states that she slept well last night and is not in any significant pain at rest.  However, she does have increased pain with turning and other movements.   Objective: Vital signs in last 24 hours: Temp:  [97.8 F (36.6 C)-98.8 F (37.1 C)] 98.6 F (37 C) (09/08 0829) Pulse Rate:  [63-77] 68 (09/08 1018) Resp:  [16-20] 20 (09/08 0829) BP: (120-148)/(44-67) 147/54 (09/08 0829) SpO2:  [94 %-98 %] 95 % (09/08 1018)  Intake/Output from previous day: 09/07 0701 - 09/08 0700 In: 480 [P.O.:480] Out: -  Intake/Output this shift: Total I/O In: 360 [P.O.:360] Out: -   Recent Labs    10/03/18 0949 10/05/18 0553  HGB 13.6 11.0*   Recent Labs    10/03/18 0949 10/05/18 0553  WBC 12.4* 11.1*  RBC 4.47 3.59*  HCT 41.2 32.9*  PLT 181 113*   Recent Labs    10/03/18 1109  NA 137  K 3.9  CL 99  CO2 25  BUN 35*  CREATININE 0.98  GLUCOSE 156*  CALCIUM 9.6   No results for input(s): LABPT, INR in the last 72 hours.  Physical Exam: The patient's examination findings remain unchanged as compared to yesterday and at admission.  Assessment: Status post bilateral sacral insufficiency fractures and bilateral superior and inferior pubic rami fractures.  Plan: Continue to mobilize as symptoms permit, and to provide appropriate pain medication as needed for discomfort.  I will sign off at this time.  If you do need further input from me during this patient's hospitalization, please contact me again and I will be happy to see her.  Otherwise, I will plan on seeing her back in the office in 1 month.  Thank you for asking me to participate in the care of this most delightful patient.   Marshall Cork Poggi 10/05/2018, 10:19 AM

## 2018-10-05 NOTE — TOC Initial Note (Signed)
Transition of Care [facility]) - Initial/Assessment Note    Patient Details  Name: Caitlin Solis MRN: [id number] Date of Birth: 1924-02-10  Transition [facility]) CM/SW Contact:    Shelbie Hutching, RN Phone Number: 10/05/2018, 10:18 AM  Clinical Narrative:                 Patient admitted with bilateral sacral insufficiency fracture after fall at home.  Patient lives home alone with son and daughter in law living close by and checking in on her frequently.  Patient reports that she feeds herself and baths herself and walks around the home at baseline.  Patient is having significant pain with movement now.  PT and OT have both recommended SNF and patient agrees if she can go to [facility].  Bed search started.    Expected Discharge Plan: Skilled Nursing Facility Barriers to Discharge: Continued Medical Work up   Patient Goals and CMS Choice   CMS Medicare.gov Compare Post Acute Care list provided to:: Patient Choice offered to / list presented to : Patient  Expected Discharge Plan and Services Expected Discharge Plan: [place]   Discharge Planning Services: CM Consult Post Acute Care Choice: [place] Living arrangements for the past 2 months: Single Family Home Expected Discharge Date: 10/05/18                                    Prior Living Arrangements/Services Living arrangements for the past 2 months: Single Family Home   Patient language and need for interpreter reviewed:: No        Need for Family Participation in Patient Care: Yes (Comment)(pelvic fracture) Care giver support system in place?: Yes (comment)(son)   Criminal Activity/Legal Involvement Pertinent to Current Situation/Hospitalization: No - Comment as needed  Activities of Daily Living Home Assistive Devices/Equipment: Wheelchair, Grab bars in shower, Eyeglasses, Dentures (specify type), Grab bars around toilet, Bedside commode/3-in-1 ADL Screening (condition at  time of admission) Patient's cognitive ability adequate to safely complete daily activities?: No Is the patient deaf or have difficulty hearing?: Yes Does the patient have difficulty seeing, even when wearing glasses/contacts?: No Does the patient have difficulty concentrating, remembering, or making decisions?: No Patient able to express need for assistance with ADLs?: Yes Does the patient have difficulty dressing or bathing?: No Independently performs ADLs?: Yes (appropriate for developmental age) Does the patient have difficulty walking or climbing stairs?: Yes Weakness of Legs: Both Weakness of Arms/Hands: Both  Permission Sought/Granted Permission sought to share information with : Case Manager, Customer service manager, Family Supports Permission granted to share information with : Yes, Verbal Permission Granted     Permission granted to share info w AGENCY: Dietitian granted to share info w Relationship: Son     Emotional Assessment Appearance:: Appears stated age Attitude/Demeanor/Rapport: Engaged Affect (typically observed): Accepting Orientation: : Oriented to Self, Oriented to Place, Oriented to  Time, Oriented to Situation Alcohol / Substance Use: Not Applicable Psych Involvement: No (comment)  Admission diagnosis:  Closed nondisplaced fracture of pelvis, unspecified part of pelvis, initial encounter ([place]) [S32.9XXA] Patient Active Problem List   Diagnosis Date Noted  . Bilateral sacral insufficiency fracture 10/03/2018   PCP:  Patient, No Pcp Per Pharmacy:   [place], [place] - [address] [place] [address] Phone: [phone] Fax: [phone]     Social Determinants of  Health (SDOH) Interventions    Readmission Risk Interventions No flowsheet data found.

## 2018-10-05 NOTE — Progress Notes (Signed)
Physical Therapy Treatment Patient Details Name: Caitlin Solis MRN: [id number] DOB: August 26, 1924 Today's Date: 10/05/2018    History of Present Illness Patient is an 85 year old female with history of hypertension, hyperlipidemia, CAD and peripheral vascular disease who presented to the emergency room with pain following a mechanical fall and sustained pelvic fractures.     PT Comments    Pt in bed pre-medicated and generally comfortable.  Agrees to exercises but fearful of OOB attempts due to pain.  Encouragement provided and session limited to very slow supine AA/PROM.  She did tolerate increased reps and ROM on LLE but unable to tolerate full reps on right with limited ROM to only a few degrees of motion. Encouragement provided and educated on expectations of recover.  Continue with slow, gentle ROM as tolerated.  Pt unable to tolerate progression of mobility at this time due to pain.     Follow Up Recommendations  SNF     Equipment Recommendations  None recommended by PT    Recommendations for Other Services       Precautions / Restrictions Precautions Precautions: Fall Restrictions Weight Bearing Restrictions: No Other Position/Activity Restrictions: Dr Binnie RailPoggi's note specifies "can be mobilized with physical therapy, performing standing for transfers and sitting in a wheelchair as symptoms permit"    Mobility  Bed Mobility               General bed mobility comments: deferred  Transfers                    Ambulation/Gait                 Stairs             Wheelchair Mobility    Modified Rankin (Stroke Patients Only)       Balance                                            Cognition Arousal/Alertness: Awake/alert Behavior During Therapy: WFL for tasks assessed/performed Overall Cognitive Status: Within Functional Limits for tasks assessed                                        Exercises  Other Exercises Other Exercises: BLE- ankle pumps, SAQ, heel slides x 10, LLE SLR and ab/add x 10 - generally tolerates LLE ROM better, limited ROM BLE Other Exercises: ther-ex with yellow theraband for shoulder flexion, horizontal abduction, and elbow flexion, 2x5 per ex with rest breaks in between 2/2 pt feeling SOB (vitals WNL)    General Comments        Pertinent Vitals/Pain Pain Assessment: Faces Faces Pain Scale: Hurts whole lot Pain Location: with RLE ROM, Generally more comfortable with LLE but with limited ROM Pain Descriptors / Indicators: Aching;Grimacing;Guarding Pain Intervention(s): Premedicated before session;Monitored during session;Limited activity within patient's tolerance    Home Living                      Prior Function            PT Goals (current goals can now be found in the care plan section) Acute Rehab PT Goals Patient Stated Goal: To get home as soon as I can Progress towards PT goals: Progressing toward goals  Frequency    7X/week      PT Plan Current plan remains appropriate    Co-evaluation              AM-PAC PT "6 Clicks" Mobility   Outcome Measure  Help needed turning from your back to your side while in a flat bed without using bedrails?: Total Help needed moving from lying on your back to sitting on the side of a flat bed without using bedrails?: Total Help needed moving to and from a bed to a chair (including a wheelchair)?: Total Help needed standing up from a chair using your arms (e.g., wheelchair or bedside chair)?: Total Help needed to walk in hospital room?: Total Help needed climbing 3-5 steps with a railing? : Total 6 Click Score: 6    End of Session Equipment Utilized During Treatment: Gait belt Activity Tolerance: Patient limited by pain Patient left: in bed;with call bell/phone within reach;with bed alarm set;with family/visitor present         Time: 2751-7001 PT Time Calculation (min) (ACUTE  ONLY): 17 min  Charges:  $Therapeutic Exercise: 8-22 mins                    Chesley Noon, PTA 10/05/18, 11:42 AM

## 2018-10-06 LAB — SARS CORONAVIRUS 2 (TAT 6-24 HRS): SARS Coronavirus 2: NEGATIVE

## 2018-10-06 LAB — SARS CORONAVIRUS 2 BY RT PCR (HOSPITAL ORDER, PERFORMED IN ~~LOC~~ HOSPITAL LAB): SARS Coronavirus 2: NEGATIVE

## 2018-10-06 MED ORDER — FLEET ENEMA 7-19 GM/118ML RE ENEM: 1.0000 | ENEMA | Freq: Every day | RECTAL | Status: DC | PRN

## 2018-10-06 MED ORDER — FUROSEMIDE 40 MG PO TABS: 40.0000 mg | ORAL_TABLET | Freq: Every day | ORAL | Status: AC

## 2018-10-06 MED ORDER — BISACODYL 10 MG RE SUPP
10.0000 mg | Freq: Once | RECTAL | Status: AC
  Administered 2018-10-06: 10:00:00 10 mg via RECTAL
  Filled 2018-10-06: qty 1

## 2018-10-06 NOTE — TOC Transition Note (Signed)
Transition of Care [facility]) - CM/SW Discharge Note   Patient Details  Name: Caitlin Solis MRN: [id number] Date of Birth: 02-05-1924  Transition of Care [facility]) CM/SW Contact:  Aleisa Howk, Lenice Llamas Phone Number: [phone]  10/06/2018, 5:04 PM   Clinical Narrative: Patient is medically stable for D/C to [facility] today. Patient's covid test was negative today 9/9. Per [facility] admissions coordinator at [facility] patient can come today to room 505. RN will call report and arrange EMS for transport. CSW sent D/C orders to [facility] via [place]. CSW left patient's son Broadus John a voicemail making him aware of above. Please reconsult if future social work needs arise. CSW signing off.       Final next level of care: Skilled Nursing Facility Barriers to Discharge: Barriers Resolved   Patient Goals and CMS Choice   CMS Medicare.gov Compare Post Acute Care list provided to:: Patient Choice offered to / list presented to : Patient  Discharge Placement   Existing PASRR number confirmed : 10/05/18          Patient [facility] bed at: [facility] Patient to be transferred to facility by: [facility] Name of family member notified: CSW left patient's son Broadus John a Advertising account executive. Patient and family notified of of transfer: 10/06/18  Discharge Plan and Services   Discharge Planning Services: CM Consult Post Acute Care Choice: [place]          DME Arranged: N/A         HH Arranged: NA          Social Determinants of Health (SDOH) Interventions     Readmission Risk Interventions No flowsheet data found.

## 2018-10-06 NOTE — Plan of Care (Signed)
Pt is going to [facility], Rm 505.  Called report to [place] at [phone].  Removed IV and pt had large BM which was a hold up.  COVID came back negative.  EMS called for transport.

## 2018-10-06 NOTE — Discharge Summary (Signed)
[facility] [place] [facility]   PATIENT NAME: Caitlin Solis    MR#:  [id number]  DATE OF BIRTH:  04-Sep-1924  DATE OF ADMISSION:  10/03/2018   ADMITTING PHYSICIAN: Jimmye Norman, NP  DATE OF DISCHARGE: 10/06/2018 PRIMARY CARE PHYSICIAN: Patient, No Pcp Per   ADMISSION DIAGNOSIS:  Closed nondisplaced fracture of pelvis, unspecified part of pelvis, initial encounter (HCC) [S32.9XXA] DISCHARGE DIAGNOSIS:  Active Problems:   Bilateral sacral insufficiency fracture  SECONDARY DIAGNOSIS:   Past Medical History:  Diagnosis Date  . Hypertension    HOSPITAL COURSE:  85 y.o.femalewith past medical history of hypertension, hyperparathyroidism, CHF,CAD, hyperlipidemia, PVD, bradycardia, and osteoporosis wheelchair bound at baseline presenting to the ED with chief complaints of right hip pain following a fall  1.Pelvic fractures-secondary to mechanical fall -CT pelvis showed bilateral sacral and pubic ramus fractures, there is also focal hematoma in the inferior aspect of the right gluteal maximus -No acute findings on CT of the head. -PRN pain medication -Orthopedic consult to Dr.Poggie appreciated, he suggested to manage with pain medication and work on physical therapy and rehab placement.  2. ChronicDiastolicCongestive Heart Failure: BNPmildlyelevated at150no evidence of exacerbation - Chest x-ray showsnopulmonary vascular congestion -Last Echo7/2018, EF 55%. Echo this time: The left ventricle has normal systolic function, with an ejection fraction of 55-60%. -Continue lisinopril and Coreg -Hold Lasix, resume with half dose 40 mg daily after discharge. - Low salt diet - Check daily weight - Strict I&Os  3. Atrial fibrillation-noted on monitor, no history of A. fib likely new onset -Following with Dr. Gwen Pounds Heart rate is under control now.  4.Coronary Artery Disease  -HoldASA in the setting of focal hematoma May  resume.  5.HLD  + Goal LDL<100  6.HTN  + Goal BP <130/80 -Continue lisinopril, hydralazine, Coreg and amlodipine  7.DVT prophylaxis - Hold anti-coagulation forhematoma  PT evaluation suggest skilled nursing facility. DISCHARGE CONDITIONS:  Stable, discharge to skilled nursing facility today. CONSULTS OBTAINED:   DRUG ALLERGIES:   Allergies  Allergen Reactions  . Mirabegron Nausea Only and Nausea And Vomiting   DISCHARGE MEDICATIONS:   Allergies as of 10/06/2018      Reactions   Mirabegron Nausea Only, Nausea And Vomiting      Medication List    TAKE these medications   alendronate 70 MG tablet Commonly known as: FOSAMAX Take 70 mg by mouth every 7 (seven) days.   amLODipine 5 MG tablet Commonly known as: NORVASC Take 5 mg by mouth daily.   aspirin 325 MG tablet Take 325 mg by mouth at bedtime.   calcium-vitamin D 500-200 MG-UNIT tablet Commonly known as: OSCAL WITH D Take 1 tablet by mouth daily.   carvedilol 25 MG tablet Commonly known as: COREG Take 25 mg by mouth 2 (two) times daily.   cholecalciferol 25 MCG (1000 UT) tablet Commonly known as: VITAMIN D3 Take 1,000 Units by mouth daily.   furosemide 40 MG tablet Commonly known as: LASIX Take 1 tablet (40 mg total) by mouth daily. What changed:   medication strength  how much to take   hydrALAZINE 25 MG tablet Commonly known as: APRESOLINE Take 25 mg by mouth 2 (two) times daily.   HYDROcodone-acetaminophen 5-325 MG tablet Commonly known as: NORCO/VICODIN Take 1 tablet by mouth every 6 (six) hours as needed for up to 3 days for moderate pain or severe pain.   lisinopril 40 MG tablet Commonly known as: ZESTRIL Take 40 mg by mouth daily.  potassium chloride 10 MEQ tablet Commonly known as: K-DUR Take 10 mEq by mouth daily.        DISCHARGE INSTRUCTIONS:  See AVS.  If you experience worsening of your admission symptoms, develop shortness of breath, life threatening  emergency, suicidal or homicidal thoughts you must seek medical attention immediately by calling 911 or calling your MD immediately  if symptoms less severe.  You Must read complete instructions/literature along with all the possible adverse reactions/side effects for all the Medicines you take and that have been prescribed to you. Take any new Medicines after you have completely understood and accpet all the possible adverse reactions/side effects.   Please note  You were cared for by a hospitalist during your hospital stay. If you have any questions about your discharge medications or the care you received while you were in the hospital after you are discharged, you can call the unit and asked to speak with the hospitalist on call if the hospitalist that took care of you is not available. Once you are discharged, your primary care physician will handle any further medical issues. Please note that NO REFILLS for any discharge medications will be authorized once you are discharged, as it is imperative that you return to your primary care physician (or establish a relationship with a primary care physician if you do not have one) for your aftercare needs so that they can reassess your need for medications and monitor your lab values.    On the day of Discharge:  VITAL SIGNS:  Blood pressure (!) 131/51, pulse (!) 58, temperature 98.3 F (36.8 C), resp. rate 17, height 5\' 4"  (1.626 m), weight 54.4 kg, SpO2 96 %. PHYSICAL EXAMINATION:  GENERAL:  85 y.o.-year-old patient lying in the bed with no acute distress.  EYES: Pupils equal, round, reactive to light and accommodation. No scleral icterus. Extraocular muscles intact.  HEENT: Head atraumatic, normocephalic. Oropharynx and nasopharynx clear.  NECK:  Supple, no jugular venous distention. No thyroid enlargement, no tenderness.  LUNGS: Normal breath sounds bilaterally, no wheezing, rales,rhonchi or crepitation. No use of accessory muscles of  respiration.  CARDIOVASCULAR: S1, S2 normal. No murmurs, rubs, or gallops.  ABDOMEN: Soft, non-tender, non-distended. Bowel sounds present. No organomegaly or mass.  EXTREMITIES: No pedal edema, cyanosis, or clubbing.  NEUROLOGIC: Cranial nerves II through XII are intact. Muscle strength 3-4/5 in all extremities. Sensation intact. Gait not checked.  PSYCHIATRIC: The patient is alert and oriented x 2.  SKIN: No obvious rash, lesion, or ulcer.  DATA REVIEW:   CBC Recent Labs  Lab 10/05/18 0553  WBC 11.1*  HGB 11.0*  HCT 32.9*  PLT 113*    Chemistries  Recent Labs  Lab 10/03/18 1109  NA 137  K 3.9  CL 99  CO2 25  GLUCOSE 156*  BUN 35*  CREATININE 0.98  CALCIUM 9.6     Microbiology Results  Results for orders placed or performed during the hospital encounter of 10/03/18  SARS CORONAVIRUS 2 (TAT 6-24 HRS) Nasopharyngeal Nasopharyngeal Swab     Status: None   Collection Time: 10/03/18 11:58 AM   Specimen: Nasopharyngeal Swab  Result Value Ref Range Status   SARS Coronavirus 2 NEGATIVE NEGATIVE Final    Comment: (NOTE) SARS-CoV-2 target nucleic acids are NOT DETECTED. The SARS-CoV-2 RNA is generally detectable in upper and lower respiratory specimens during the acute phase of infection. Negative results do not preclude SARS-CoV-2 infection, do not rule out co-infections with other pathogens, and should not be used  as the sole basis for treatment or other patient management decisions. Negative results must be combined with clinical observations, patient history, and epidemiological information. The expected result is Negative. Fact Sheet for Patients: HairSlick.nohttps://www.fda.gov/media/138098/download Fact Sheet for Healthcare Providers: quierodirigir.comhttps://www.fda.gov/media/138095/download This test is not yet approved or cleared by the [place] FDA and  has been authorized for detection and/or diagnosis of SARS-CoV-2 by FDA under an Emergency Use Authorization (EUA). This EUA will  remain  in effect (meaning this test can be used) for the duration of the COVID-19 declaration under Section 56 4(b)(1) of the Act, 21 U.S.C. section 360bbb-3(b)(1), unless the authorization is terminated or revoked sooner. Performed at [facility], [address] [place], [place] [address]     RADIOLOGY:  No results found.   Management plans discussed with the patient, family and they are in agreement.  CODE STATUS: DNR   TOTAL TIME TAKING CARE OF THIS PATIENT: 33 minutes.    Shaune PollackQing Julious Langlois M.D on 10/06/2018 at 7:46 AM  Between 7am to 6pm - Pager - [phone]  After 6pm go to [email or web address] - Social research officer, governmentpassword EPAS [facility]  [facility] [place] [facility]  Office  [phone]  CC: Primary care physician; Patient, No Pcp Per   Note: This dictation was prepared with Dragon dictation along with smaller phrase technology. Any transcriptional errors that result from this process are unintentional.

## 2018-10-06 NOTE — Care Management Important Message (Signed)
Important Message  Patient Details  Name: Caitlin Solis MRN: [id number] Date of Birth: 10/23/1924   Medicare Important Message Given:  Yes     Juliann Pulse A Najah Liverman 10/06/2018, 11:30 AM

## 2018-10-06 NOTE — Progress Notes (Signed)
PT Cancellation Note  Patient Details Name: Caitlin Solis MRN: [id number] DOB: Apr 15, 1924   Cancelled Treatment:    Reason Eval/Treat Not Completed: Pain limiting ability to participate;Other (comment). Pt is planning on discharging today pending Covid results. Treatment attempted; pt/family notes pt is in quite a bit of pain at this time and fatigued. Pt refuses. Discussion with pt and family regarding coordinating pain management with nursing, so that pt does not suffer unnecessarily. Pt will call nursing.     Larae Grooms, PTA 10/06/2018, 12:39 PM

## 2019-01-28 DEATH — deceased

## 2020-10-23 IMAGING — CT CT PELVIS W/O CM
2 of 3 series · 15 of 46 positions shown, 17 images · non-contrast
Comparison: Single-view of the pelvis 10/26/2011.

CLINICAL DATA: Pelvic and right hip pain after fall getting out of
a bathtub today. Initial encounter.

EXAM:
CT OF THE PELVIS EXTREMITY WITHOUT CONTRAST
TECHNIQUE: Multidetector CT imaging of the pelvis was performed according to
the standard protocol. Multiplanar CT image reconstructions were
also generated.

[Series 3: axial st · axial · 0.91mm/px · z∈[-511,-293]mm · 12 of 127 slices shown, 14 images]
[im 9/127  soft-tissue]
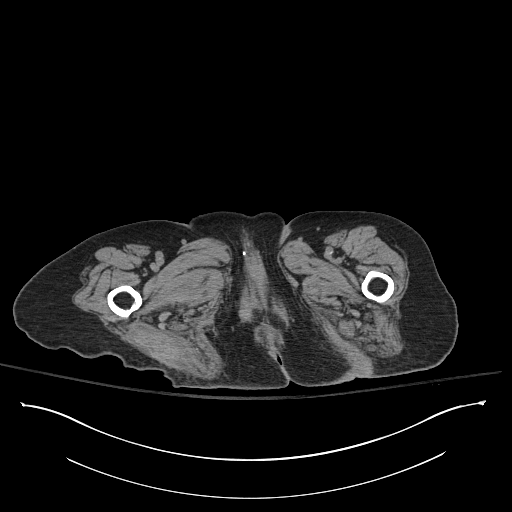
[im 9/127  bone]
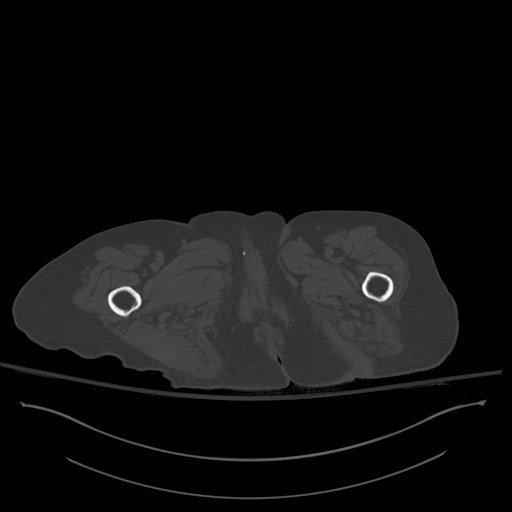
[im 17/127  soft-tissue]
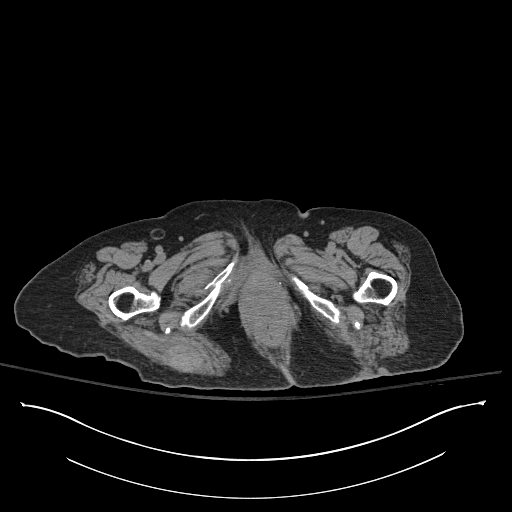
[im 29/127  soft-tissue]
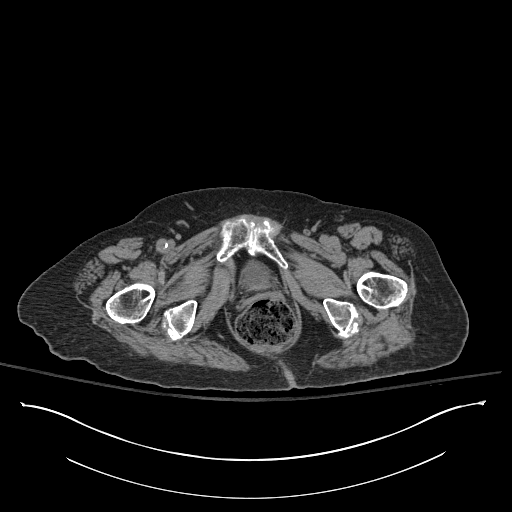
[im 37/127  soft-tissue]
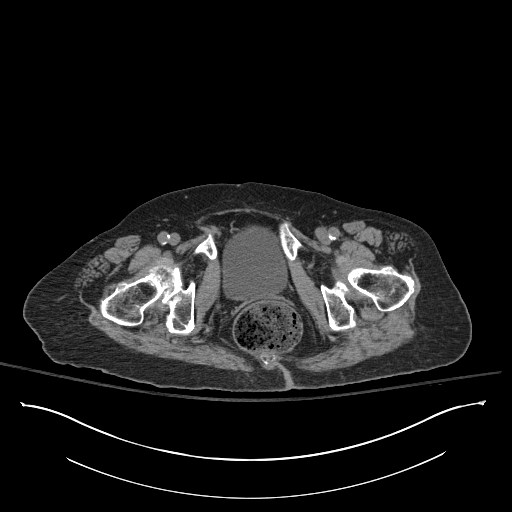
[im 49/127  soft-tissue]
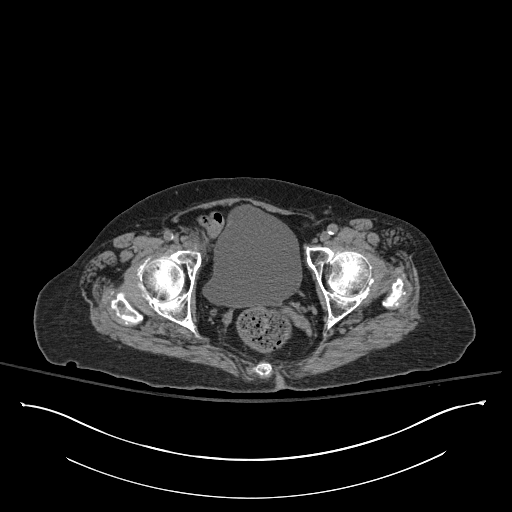
[im 57/127  soft-tissue]
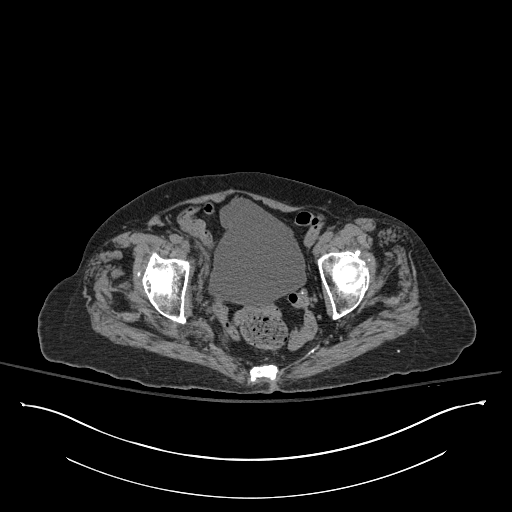
[im 70/127  soft-tissue]
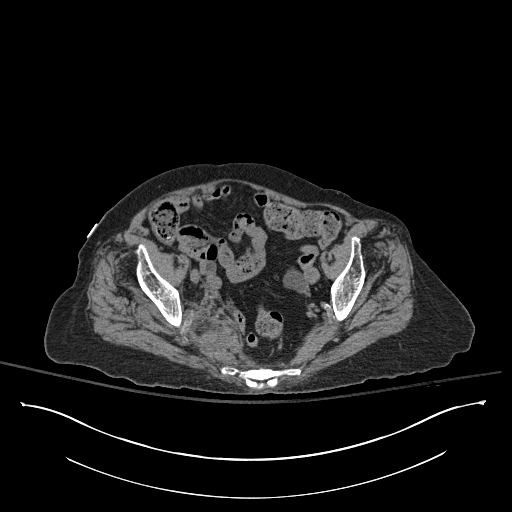
[im 78/127  soft-tissue]
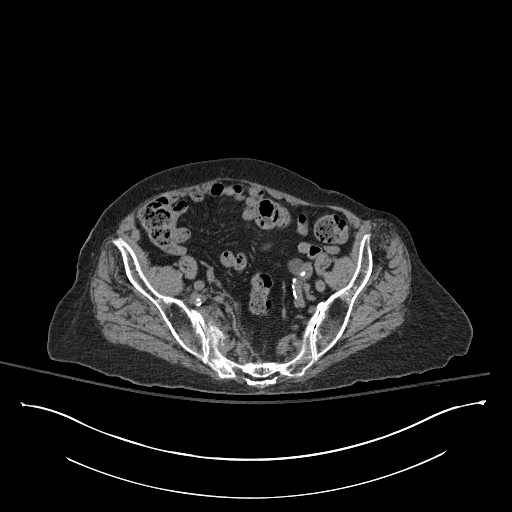
[im 90/127  soft-tissue]
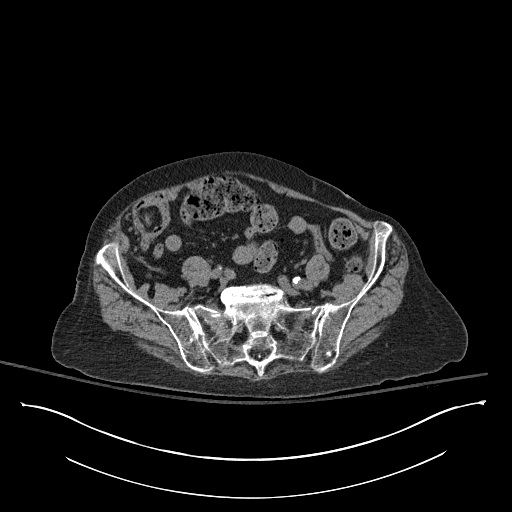
[im 90/127  bone]
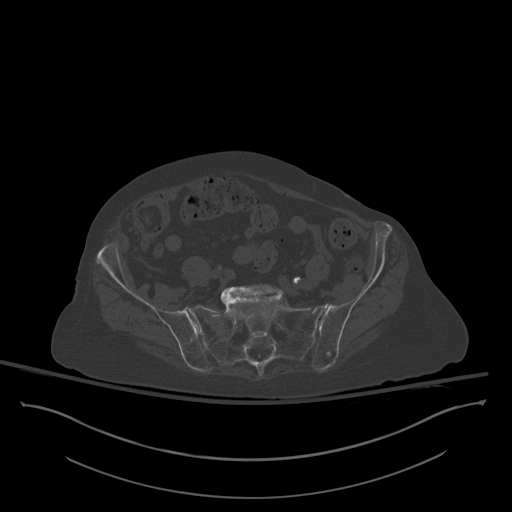
[im 98/127  soft-tissue]
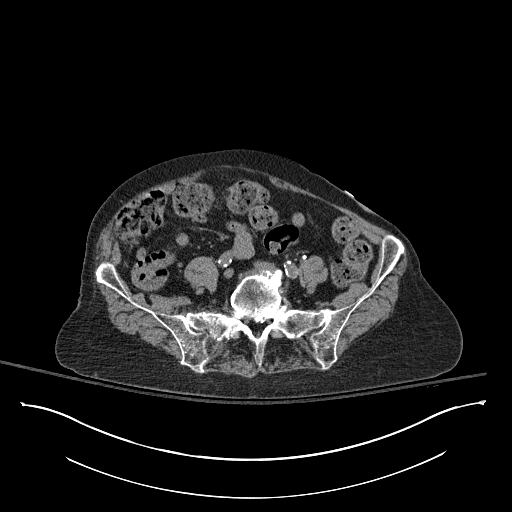
[im 110/127  soft-tissue]
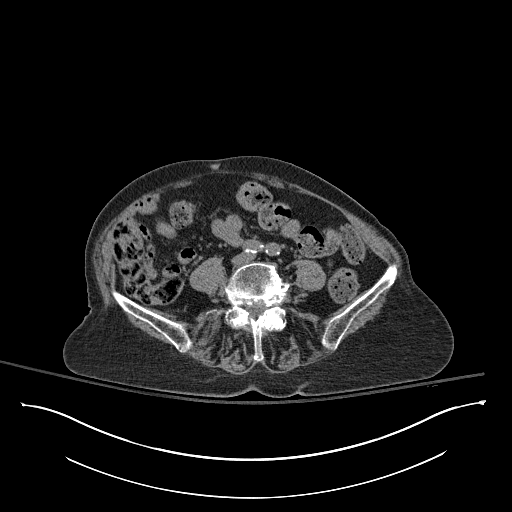
[im 118/127  soft-tissue]
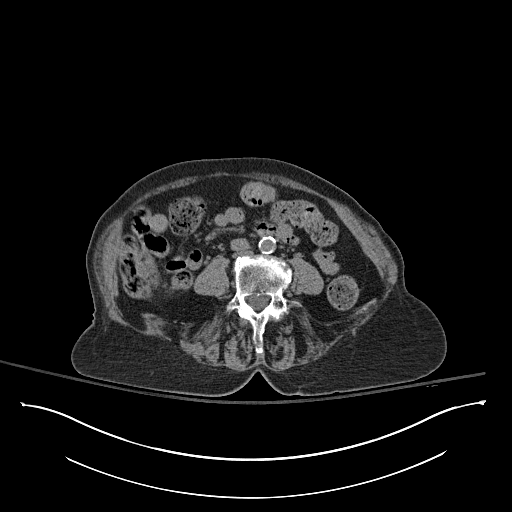

[Series 5: coronal st · coronal · 0.50mm/px · 3 of 114 slices shown]
[im 38/114  soft-tissue]
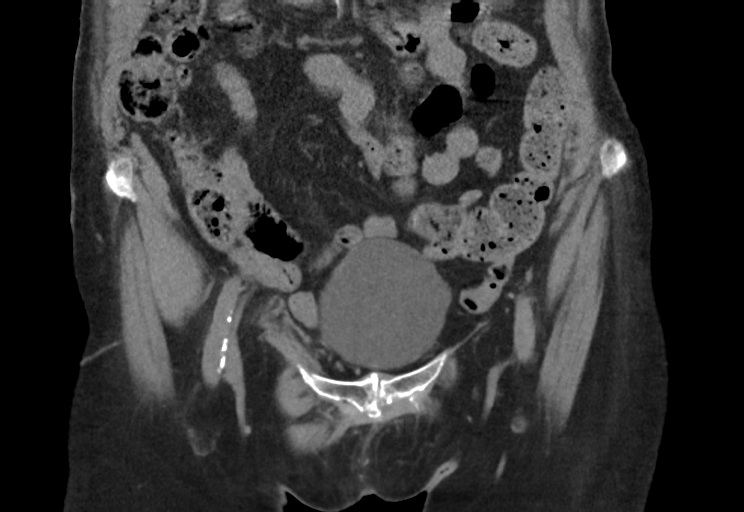
[im 51/114  soft-tissue]
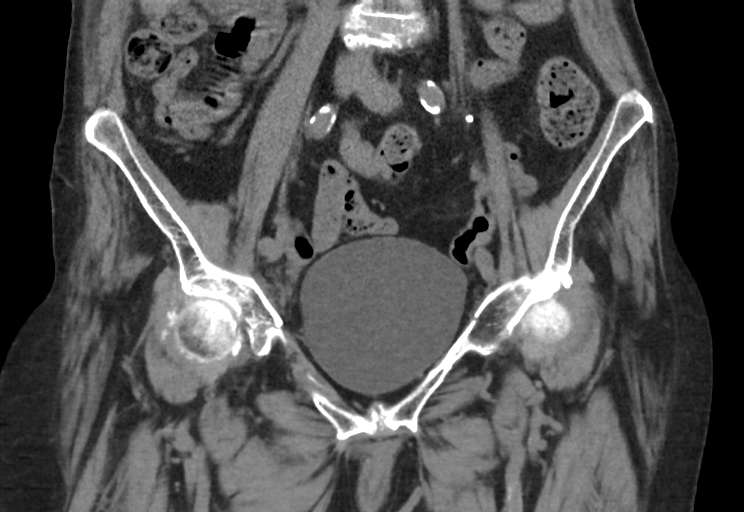
[im 63/114  soft-tissue]
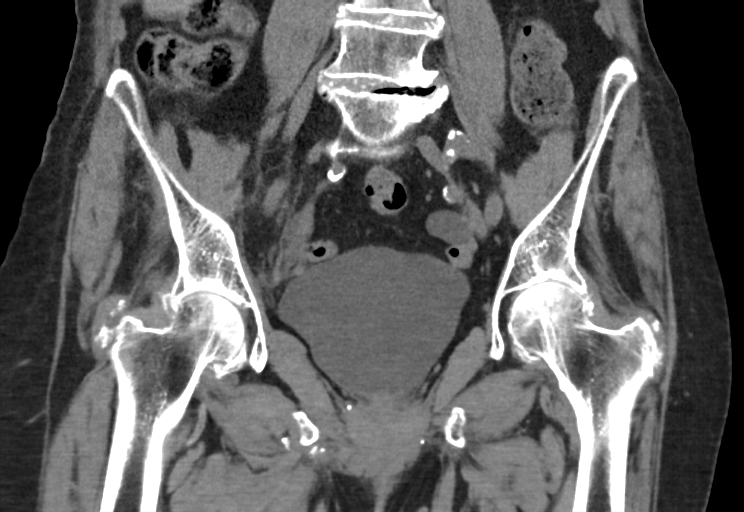

[15 of 46 positions shown; findings below may reference images not displayed]

FINDINGS: Bones/Joint/Cartilage

There are acute bilateral sacral fractures. Left sacral fracture is
nondisplaced. There is fragment override of approximately 0.7 cm in
the right sacrum. The patient also has acute nondisplaced high
bilateral pubic ramus fractures. There is a nondisplaced fracture of
the left inferior pubic ramus. Fracture of the right inferior pubic
ramus demonstrates 1 shaft width lateral displacement of the
anterior fragment and fracture override of 1 cm. No other fracture
is identified. Specifically, no hip fracture seen. Hips are located.

Moderate degenerative change is present about both hips.
Chondrocalcinosis of the left and right labrum is identified.
Degenerative change is present at the symphysis pubis. Lower lumbar
spine demonstrates loss of disc space height at L4-5 and L5-S1.
There is also facet degenerative disease at these levels.

Ligaments

Suboptimally assessed by CT.

Muscles and Tendons

There is a hematoma in the inferior aspect of the right gluteus
maximus measuring approximately 4.5 cm transverse by 2.8 cm AP by 3
cm craniocaudal. No muscle tear is identified.

Soft tissues

Imaged intrapelvic contents demonstrate postoperative change of
hysterectomy. Infiltration of subcutaneous fat inferior to the
ischial tuberosities bilaterally may be due to decubitus ulcers.
IMPRESSION: The examination is positive for bilateral sacral and bilateral
superior and inferior pubic ramus fractures.

Negative for hip fracture.

Focal hematoma in the inferior aspect of the right gluteus maximus.

Findings suggestive of bilateral decubitus ulcers over the ischial
tuberosities.

Lower lumbar spondylosis and moderate appearing bilateral hip
osteoarthritis.

## 2020-10-23 IMAGING — CT CT HEAD W/O CM
3 series · 16 of 47 positions shown, 19 images · non-contrast
Comparison: 10/11/2013

CLINICAL DATA: Head trauma with ataxia

EXAM:
CT HEAD WITHOUT CONTRAST
TECHNIQUE: Contiguous axial images were obtained from the base of the skull
through the vertex without intravenous contrast.

[Series 3: head wo · axial · 0.40mm/px · z∈[-112,+13]mm · 10 of 31 slices shown, 13 images]
[im 3/31  brain]
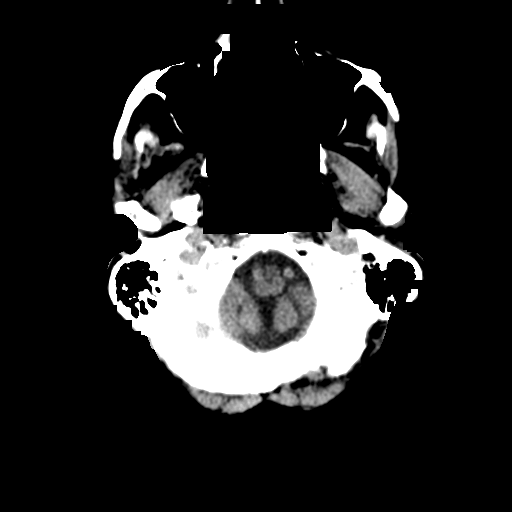
[im 3/31  bone]
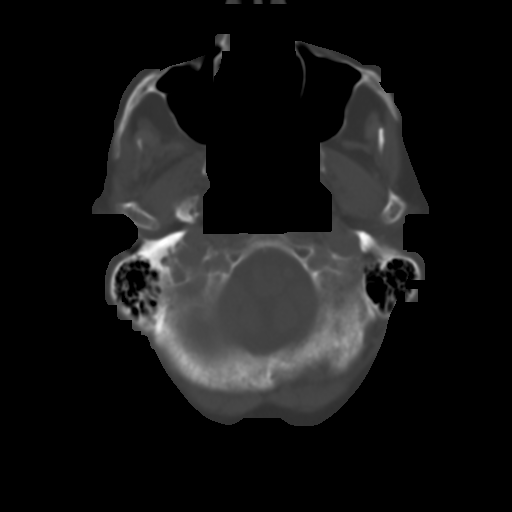
[im 6/31  brain]
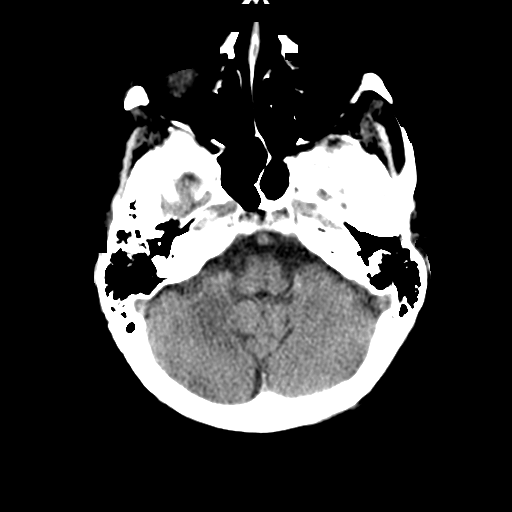
[im 9/31  brain]
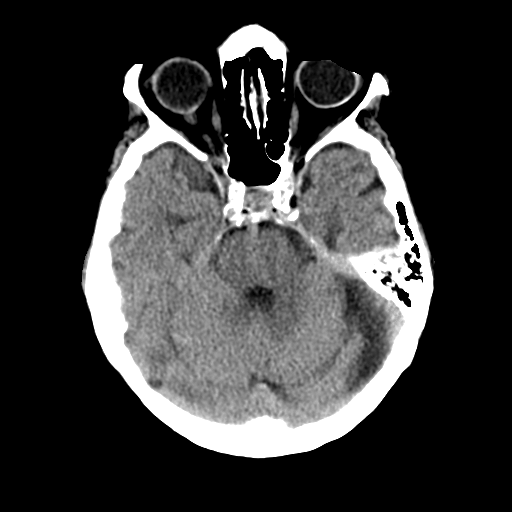
[im 11/31  brain]
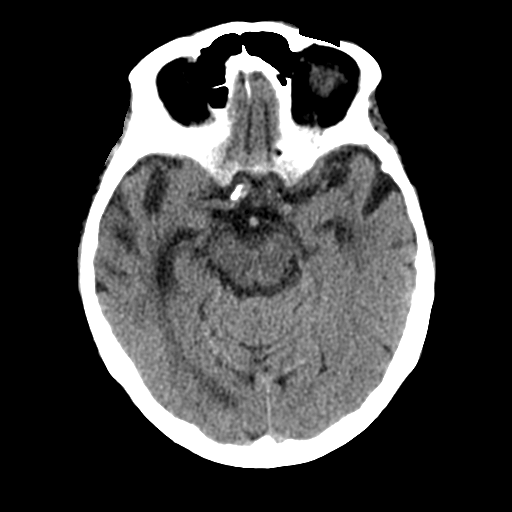
[im 14/31  brain]
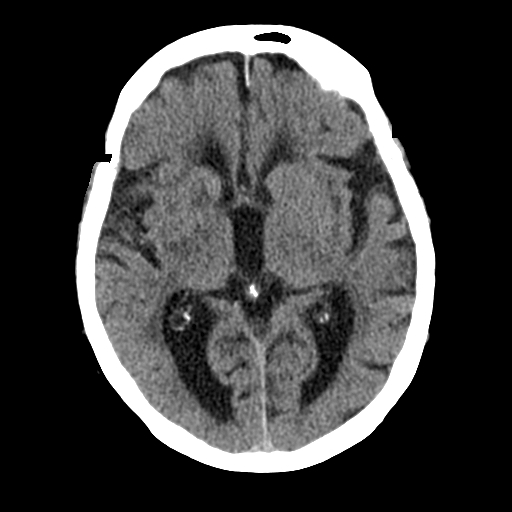
[im 14/31  bone]
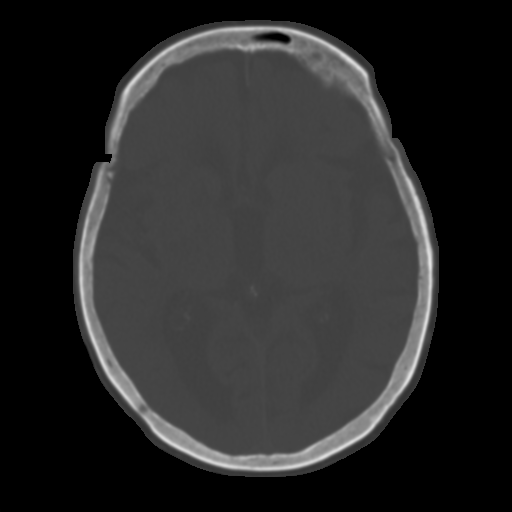
[im 17/31  brain]
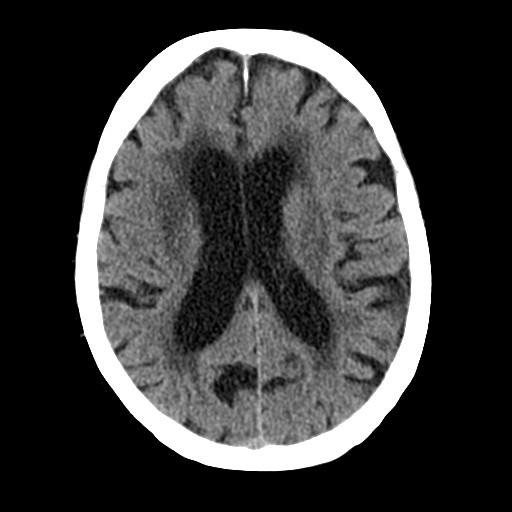
[im 20/31  brain]
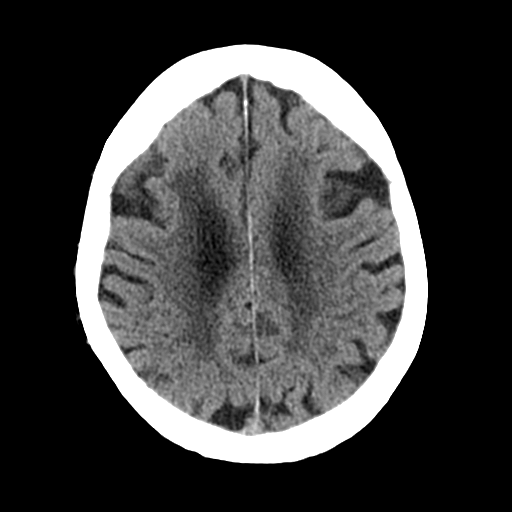
[im 23/31  brain]
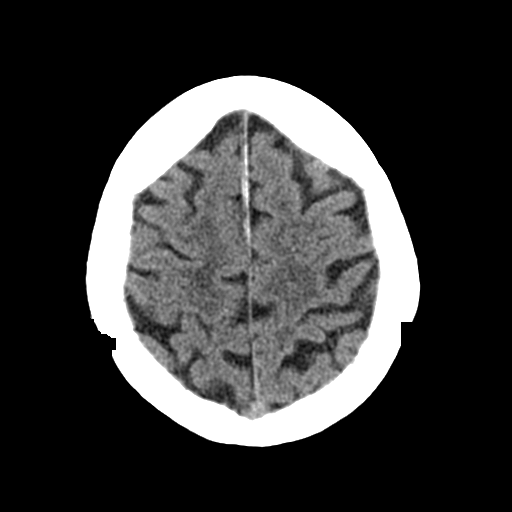
[im 25/31  brain]
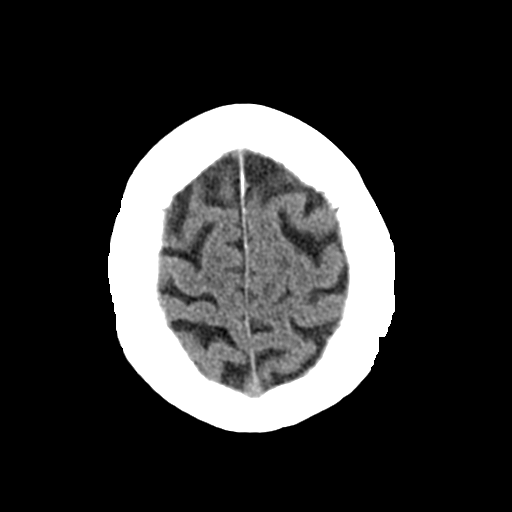
[im 25/31  bone]
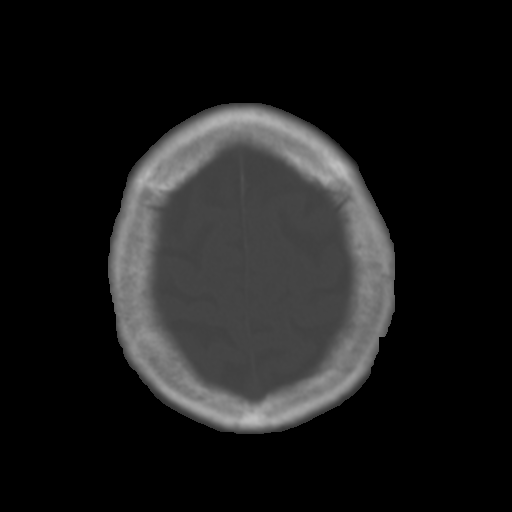
[im 28/31  brain]
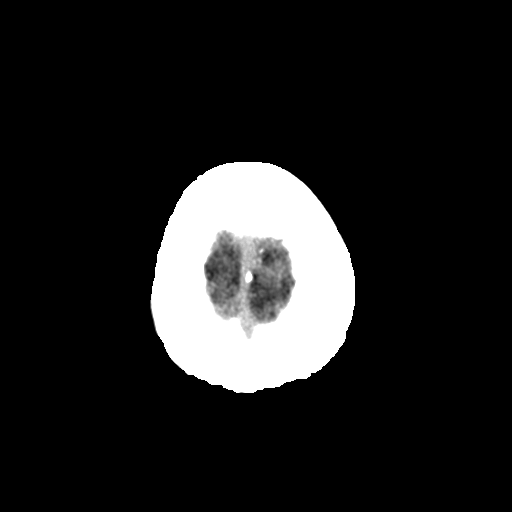

[Series 4: coronal soft tissue · coronal · 0.29mm/px · 3 of 61 slices shown]
[im 23/61  brain]
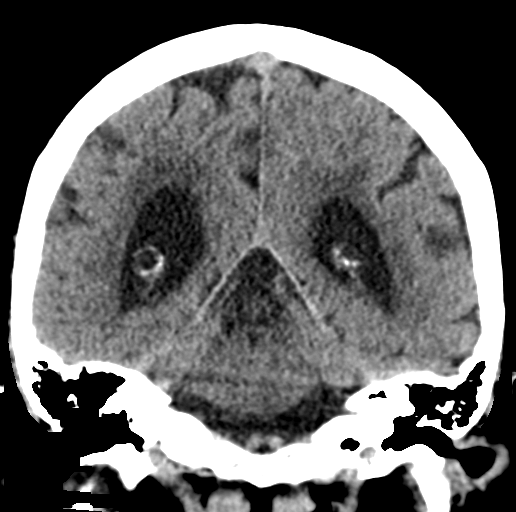
[im 28/61  brain]
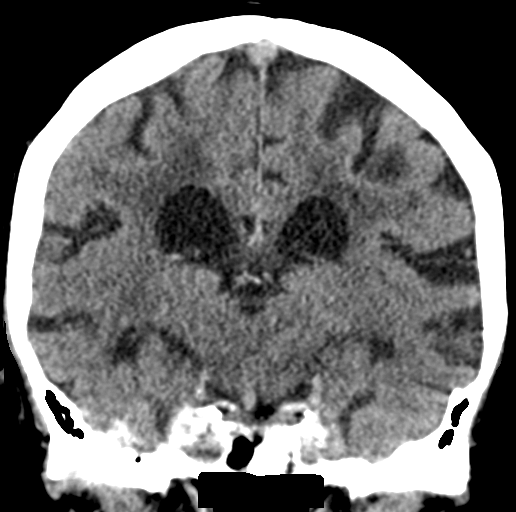
[im 33/61  brain]
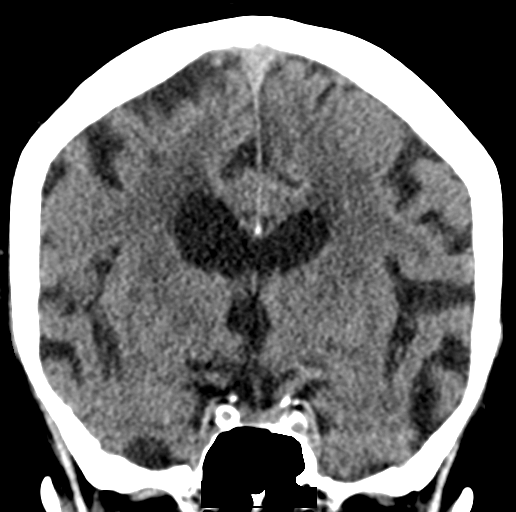

[Series 5: sagittal soft tissue · sagittal · 0.29mm/px · 3 of 50 slices shown]
[im 17/50  brain]
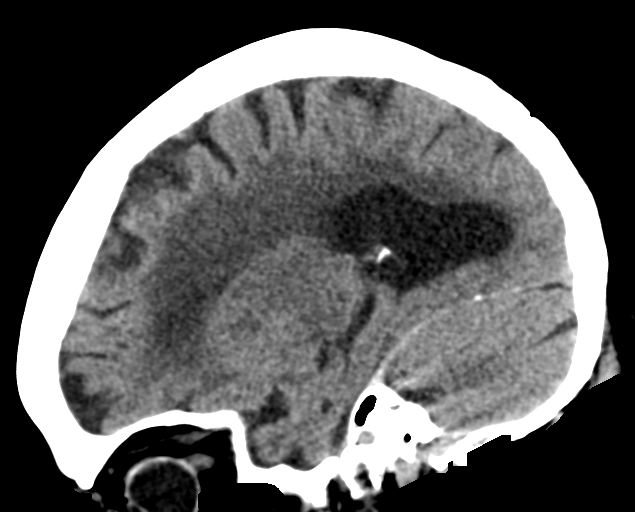
[im 25/50  brain]
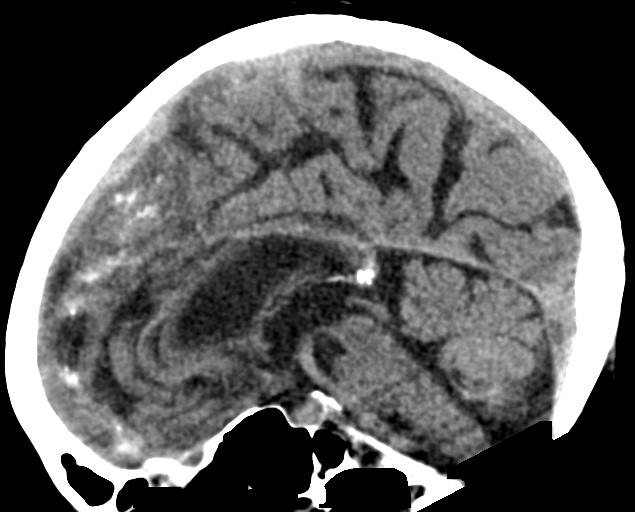
[im 33/50  brain]
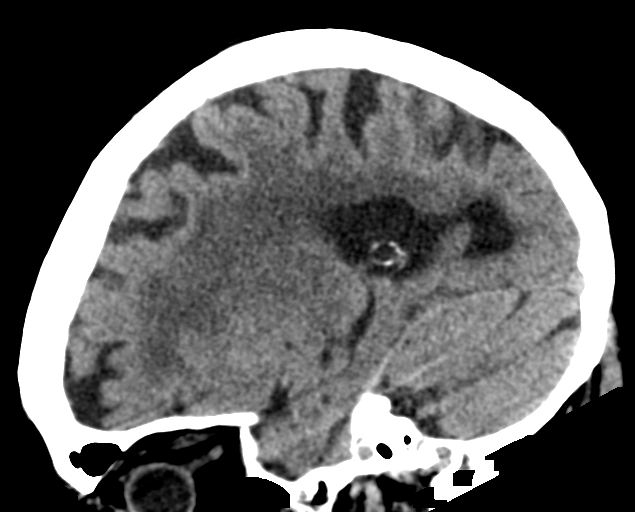

[16 of 47 positions shown; findings below may reference images not displayed]

FINDINGS: Brain: No evidence of acute infarction, hemorrhage, hydrocephalus,
extra-axial collection or mass lesion/mass effect. Chronic small
vessel ischemia with confluent low-density in the deep cerebral
white matter. Moderate atrophy, stable.

Vascular: Atherosclerotic calcification.

Skull: Negative for acute fracture.

Sinuses/Orbits: No evidence of injury. Bilateral cataract resection.
IMPRESSION: 1. No acute finding.
2. Atrophy and chronic small vessel ischemia.
